# Patient Record
Sex: Female | Born: 1950
[De-identification: ages and names within clinical notes are randomized; demographics above are authoritative.]

---

## 2017-08-18 ENCOUNTER — Emergency Department (HOSPITAL_COMMUNITY)
Admit: 2017-08-18 | Discharge: 2017-08-18 | Disposition: A | Discharge status: Home / Self Care | Payer: BLUE CROSS/BLUE SHIELD

## 2017-08-18 ENCOUNTER — Inpatient Hospital Stay (HOSPITAL_COMMUNITY)
Admit: 2017-08-18 | Discharge: 2017-08-18 | Disposition: A | Discharge status: Home / Self Care | Payer: BLUE CROSS/BLUE SHIELD

## 2017-08-18 DIAGNOSIS — R42 Dizziness and giddiness: Secondary | ICD-10-CM

## 2017-08-18 DIAGNOSIS — R519 Acute nonintractable headache, unspecified headache type: Secondary | ICD-10-CM

## 2017-08-18 DIAGNOSIS — R51 Headache: Secondary | ICD-10-CM

## 2017-12-23 ENCOUNTER — Ambulatory Visit

## 2017-12-23 DIAGNOSIS — Z853 Personal history of malignant neoplasm of breast: Secondary | ICD-10-CM

## 2017-12-23 DIAGNOSIS — E559 Vitamin D deficiency, unspecified: Secondary | ICD-10-CM

## 2017-12-25 ENCOUNTER — Ambulatory Visit: Admit: 2017-12-25 | Discharge: 2017-12-25 | Payer: BLUE CROSS/BLUE SHIELD

## 2017-12-25 DIAGNOSIS — Z853 Personal history of malignant neoplasm of breast: Secondary | ICD-10-CM

## 2017-12-25 DIAGNOSIS — E559 Vitamin D deficiency, unspecified: Secondary | ICD-10-CM

## 2017-12-25 DIAGNOSIS — F17219 Nicotine dependence, cigarettes, with unspecified nicotine-induced disorders: Secondary | ICD-10-CM

## 2017-12-25 DIAGNOSIS — F17200 Nicotine dependence, unspecified, uncomplicated: Secondary | ICD-10-CM

## 2018-01-13 ENCOUNTER — Inpatient Hospital Stay: Admit: 2018-01-13 | Discharge: 2018-01-14 | Payer: BLUE CROSS/BLUE SHIELD

## 2018-01-13 DIAGNOSIS — F17200 Nicotine dependence, unspecified, uncomplicated: Secondary | ICD-10-CM

## 2018-01-13 DIAGNOSIS — F17219 Nicotine dependence, cigarettes, with unspecified nicotine-induced disorders: Secondary | ICD-10-CM

## 2018-01-14 DIAGNOSIS — Z122 Encounter for screening for malignant neoplasm of respiratory organs: Secondary | ICD-10-CM

## 2018-01-14 DIAGNOSIS — F17210 Nicotine dependence, cigarettes, uncomplicated: Secondary | ICD-10-CM

## 2019-01-26 ENCOUNTER — Ambulatory Visit: Admit: 2019-01-26 | Discharge: 2019-01-26 | Payer: BLUE CROSS/BLUE SHIELD

## 2019-01-26 DIAGNOSIS — Z9013 Acquired absence of bilateral breasts and nipples: Secondary | ICD-10-CM

## 2019-01-26 DIAGNOSIS — R030 Elevated blood-pressure reading, without diagnosis of hypertension: Secondary | ICD-10-CM

## 2019-01-26 DIAGNOSIS — F17200 Nicotine dependence, unspecified, uncomplicated: Secondary | ICD-10-CM

## 2019-01-26 DIAGNOSIS — E559 Vitamin D deficiency, unspecified: Secondary | ICD-10-CM

## 2019-01-26 DIAGNOSIS — Z853 Personal history of malignant neoplasm of breast: Secondary | ICD-10-CM

## 2019-01-26 DIAGNOSIS — Z08 Encounter for follow-up examination after completed treatment for malignant neoplasm: Secondary | ICD-10-CM

## 2019-01-26 DIAGNOSIS — F17219 Nicotine dependence, cigarettes, with unspecified nicotine-induced disorders: Secondary | ICD-10-CM

## 2019-02-02 DIAGNOSIS — Z122 Encounter for screening for malignant neoplasm of respiratory organs: Secondary | ICD-10-CM

## 2019-02-02 DIAGNOSIS — F17200 Nicotine dependence, unspecified, uncomplicated: Secondary | ICD-10-CM

## 2019-02-03 ENCOUNTER — Inpatient Hospital Stay: Admit: 2019-02-02 | Discharge: 2019-02-03 | Payer: BLUE CROSS/BLUE SHIELD

## 2019-02-03 DIAGNOSIS — F17219 Nicotine dependence, cigarettes, with unspecified nicotine-induced disorders: Secondary | ICD-10-CM

## 2019-04-14 ENCOUNTER — Ambulatory Visit

## 2019-04-14 DIAGNOSIS — Z23 Encounter for immunization: Secondary | ICD-10-CM

## 2019-07-16 DIAGNOSIS — M549 Dorsalgia, unspecified: Secondary | ICD-10-CM

## 2019-07-16 DIAGNOSIS — M791 Myalgia, unspecified site: Secondary | ICD-10-CM

## 2019-07-16 DIAGNOSIS — M533 Sacrococcygeal disorders, not elsewhere classified: Secondary | ICD-10-CM

## 2019-07-17 ENCOUNTER — Emergency Department (HOSPITAL_COMMUNITY)
Admit: 2019-07-17 | Discharge: 2019-07-17 | Disposition: A | Discharge status: Home / Self Care | Payer: BLUE CROSS/BLUE SHIELD

## 2019-07-17 ENCOUNTER — Inpatient Hospital Stay (HOSPITAL_COMMUNITY)
Admit: 2019-07-17 | Discharge: 2019-07-17 | Disposition: A | Discharge status: Home / Self Care | Payer: BLUE CROSS/BLUE SHIELD

## 2019-07-17 DIAGNOSIS — M549 Dorsalgia, unspecified: Secondary | ICD-10-CM

## 2019-07-17 DIAGNOSIS — M533 Sacrococcygeal disorders, not elsewhere classified: Secondary | ICD-10-CM

## 2019-07-17 MED ORDER — NAPROXEN 500 MG TABLET
500 mg | ORAL_TABLET | Freq: Two times a day (BID) | ORAL | 0 refills | Status: AC
Start: 2019-07-17 — End: ?

## 2019-07-17 MED ORDER — METHOCARBAMOL 500 MG TABLET
500 mg | ORAL_TABLET | Freq: Four times a day (QID) | ORAL | 0 refills | 30.00 days | Status: AC | PRN
Start: 2019-07-17 — End: ?

## 2020-01-12 ENCOUNTER — Ambulatory Visit (HOSPITAL_COMMUNITY)

## 2020-01-12 DIAGNOSIS — Z853 Personal history of malignant neoplasm of breast: Secondary | ICD-10-CM

## 2020-01-12 DIAGNOSIS — E559 Vitamin D deficiency, unspecified: Secondary | ICD-10-CM

## 2020-01-17 ENCOUNTER — Inpatient Hospital Stay: Admit: 2020-01-17 | Discharge: 2020-01-21 | Payer: Commercial Managed Care - Pharmacy Benefit Manager

## 2020-01-17 DIAGNOSIS — J069 Acute upper respiratory infection, unspecified: Secondary | ICD-10-CM

## 2020-01-17 DIAGNOSIS — R0902 Hypoxemia: Secondary | ICD-10-CM

## 2020-01-17 DIAGNOSIS — J441 Chronic obstructive pulmonary disease with (acute) exacerbation: Secondary | ICD-10-CM

## 2020-01-20 MED ORDER — PREDNISONE 10 MG TABLET
ORAL_TABLET | 0 refills | Status: AC
Start: 2020-01-20 — End: ?

## 2020-01-20 MED ORDER — BENZONATATE 200 MG CAPSULE
200 mg | ORAL_CAPSULE | Freq: Three times a day (TID) | ORAL | 0 refills | Status: AC | PRN
Start: 2020-01-20 — End: ?

## 2020-01-20 MED ORDER — GUAIFENESIN ER 600 MG TABLET, EXTENDED RELEASE 12 HR
600 mg | ORAL_TABLET | Freq: Two times a day (BID) | ORAL | 0 refills | 30.00 days | Status: AC
Start: 2020-01-20 — End: ?

## 2020-12-29 ENCOUNTER — Ambulatory Visit (HOSPITAL_COMMUNITY): Admit: 2020-12-29 | Discharge: 2020-12-29 | Payer: Commercial Managed Care - Pharmacy Benefit Manager

## 2020-12-29 ENCOUNTER — Ambulatory Visit (HOSPITAL_COMMUNITY)

## 2020-12-29 DIAGNOSIS — E559 Vitamin D deficiency, unspecified: Secondary | ICD-10-CM

## 2020-12-29 DIAGNOSIS — Z13820 Encounter for screening for osteoporosis: Secondary | ICD-10-CM

## 2020-12-29 DIAGNOSIS — Z853 Personal history of malignant neoplasm of breast: Secondary | ICD-10-CM

## 2020-12-29 DIAGNOSIS — Z08 Encounter for follow-up examination after completed treatment for malignant neoplasm: Secondary | ICD-10-CM

## 2020-12-29 DIAGNOSIS — M4854XA Collapsed vertebra, not elsewhere classified, thoracic region, initial encounter for fracture: Secondary | ICD-10-CM

## 2020-12-29 DIAGNOSIS — F17219 Nicotine dependence, cigarettes, with unspecified nicotine-induced disorders: Secondary | ICD-10-CM

## 2020-12-29 DIAGNOSIS — Z780 Asymptomatic menopausal state: Secondary | ICD-10-CM

## 2021-01-05 ENCOUNTER — Inpatient Hospital Stay (HOSPITAL_COMMUNITY): Admit: 2021-01-05 | Discharge: 2021-01-05 | Payer: Commercial Managed Care - Pharmacy Benefit Manager

## 2021-01-05 DIAGNOSIS — F17219 Nicotine dependence, cigarettes, with unspecified nicotine-induced disorders: Secondary | ICD-10-CM

## 2021-03-13 IMAGING — CT CT ABDOMEN PELVIS W/ UROGRAM
2 of 6 series · 14 of 46 positions shown, 16 images · IV contrast (APPLIED)
Comparison: None.

________________________________________________________________________________________________ 
CT ABDOMEN PELVIS W/ UROGRAM, 03/13/2021 [DATE]: 
CLINICAL INDICATION: Recent UTI. 
A search for DICOM formatted images was conducted for prior CT imaging studies 
completed at a non-affiliated media free facility.
TECHNIQUE: The region of interest was scanned without and with 100 mL Isovue 
300 IV contrast injected intravenously on a high resolution CT scanner using 
dose reduction techniques.  Routine MPR reconstructions were performed.

[Series 5: portal · axial · portal-venous · 0.88mm/px · z∈[-476,-83]mm · 11 of 159 slices shown, 13 images]
[im 14/159  soft-tissue]
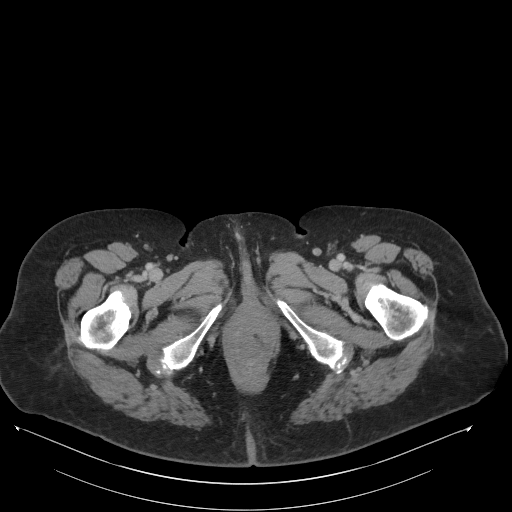
[im 14/159  bone]
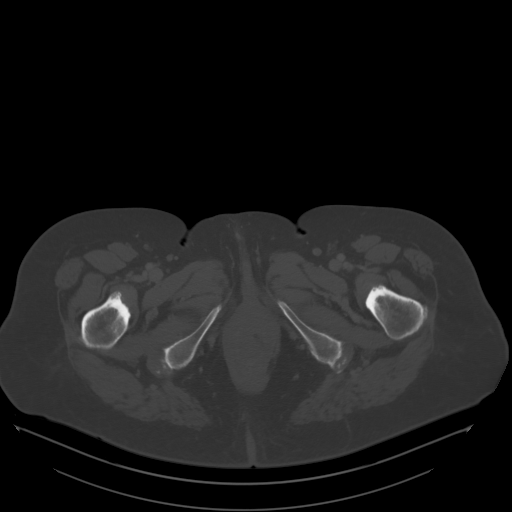
[im 27/159  soft-tissue]
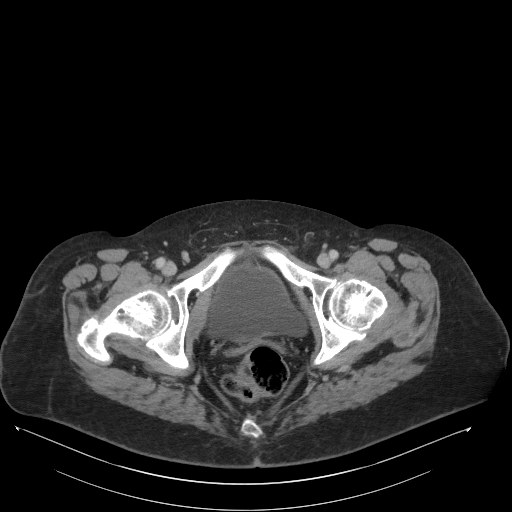
[im 40/159  soft-tissue]
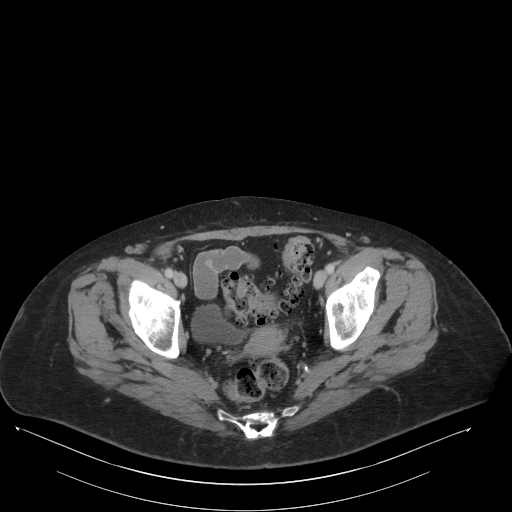
[im 53/159  soft-tissue]
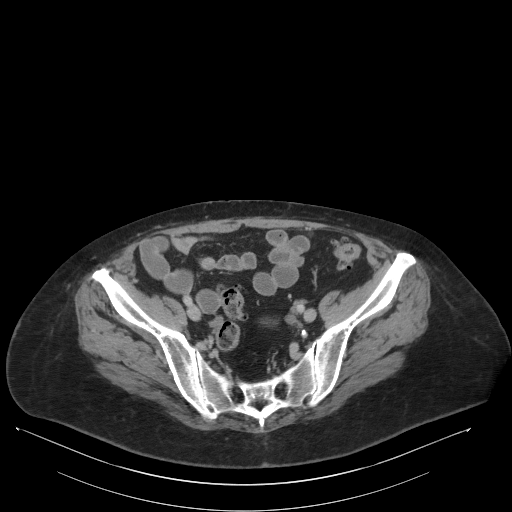
[im 66/159  soft-tissue]
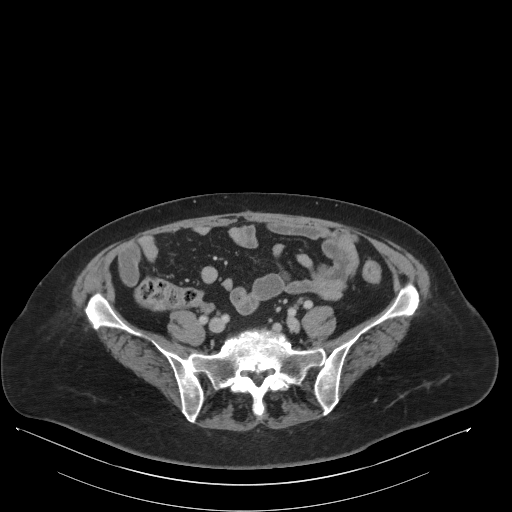
[im 80/159  soft-tissue]
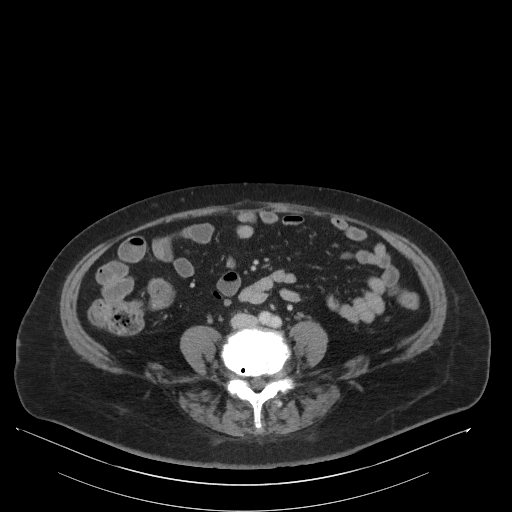
[im 93/159  soft-tissue]
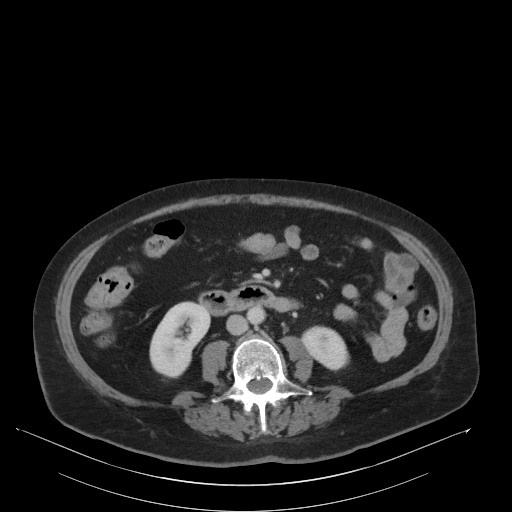
[im 106/159  soft-tissue]
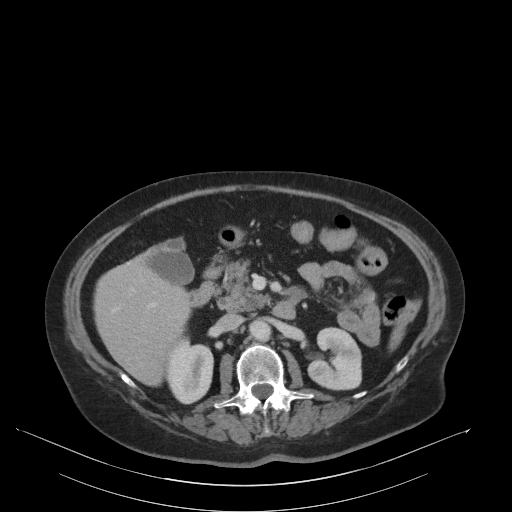
[im 119/159  soft-tissue]
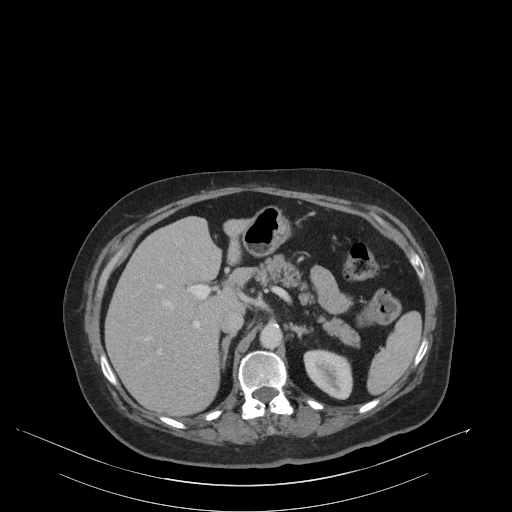
[im 119/159  bone]
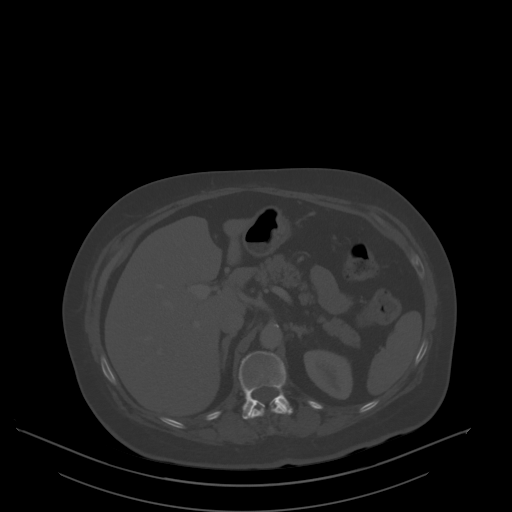
[im 132/159  soft-tissue]
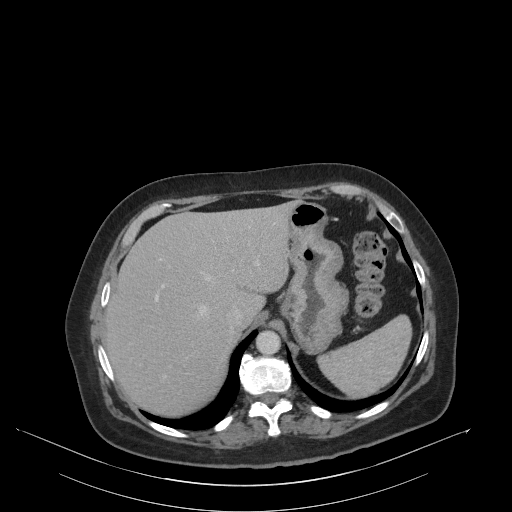
[im 145/159  soft-tissue]
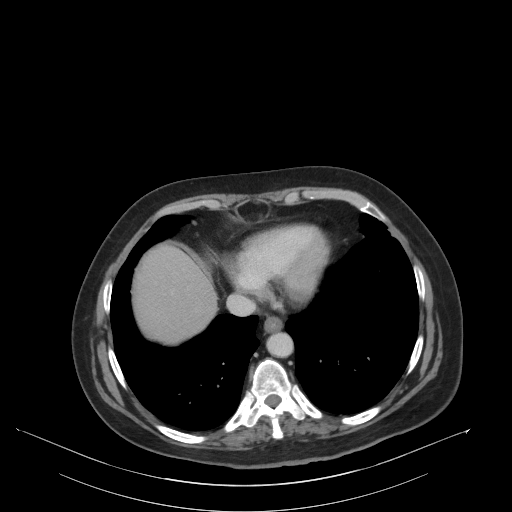

[Series 6: coronal · coronal · 0.94mm/px · 3 of 151 slices shown]
[im 38/151  soft-tissue]
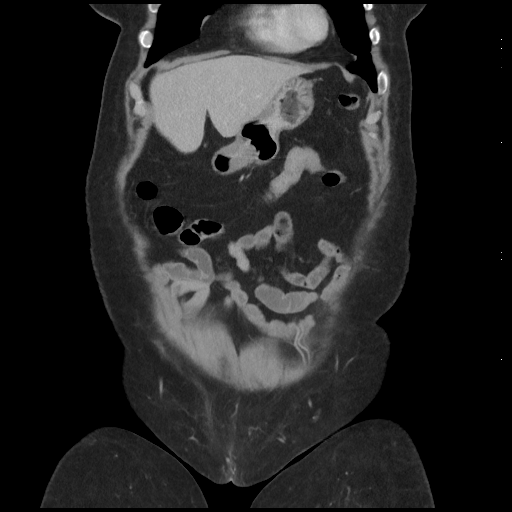
[im 76/151  soft-tissue]
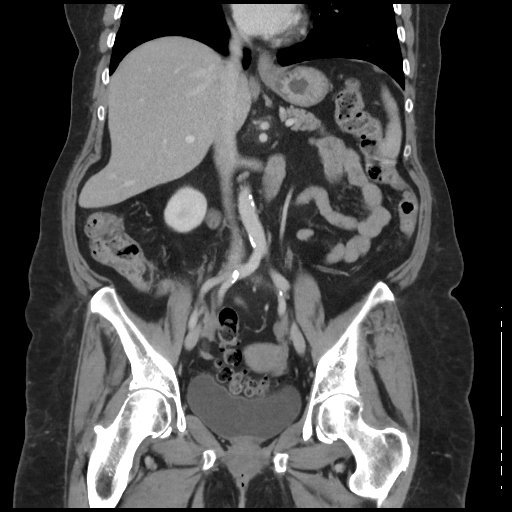
[im 113/151  soft-tissue]
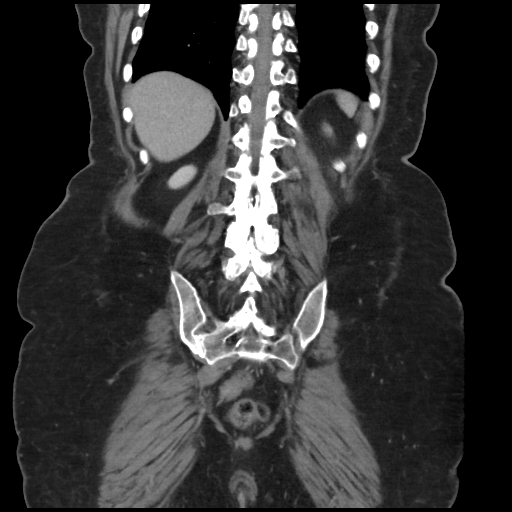

[14 of 46 positions shown; findings below may reference images not displayed]

FINDINGS: The kidneys appear normal in size. The RIGHT kidney enhances normally. In 
contrast there are patchy areas of ill-defined decreased enhancement throughout 
the LEFT renal parenchyma most likely reflecting pyelonephritis. The renal 
collecting structures are decompressed. There is no nephrolithiasis. No 
urothelial thickening or filling defects are found in either kidney. Both of the 
ureters are normal. The urinary bladder is within normal limits. The uterus is 
unremarkable. No adnexal pathology is found. There is severe chronic 
diverticular disease in the sigmoid colon with mild scattered diverticula more 
proximally along the colon. No acute appearing colonic pathology is found. The 
appendix is normal. No small bowel pathology is found. The stomach is within 
normal limits. The gallbladder and biliary tract are normal. Small hepatic cysts 
are present. There is an incidental portal vein to hepatic venous fistula in the 
inferior RIGHT lobe of the liver. Portal and hepatic veins are otherwise 
unremarkable. The main trunk of the superior mesenteric vein and inferior vena 
cava are within normal limits. Abdominal aorta shows mild atherosclerotic 
calcifications. There is no abdominal aortic aneurysm. The pancreas and spleen 
are within normal limits. The adrenal glands are unremarkable. No abnormal 
abdominal or pelvic lymph nodes are detected. There is no ascites. Degenerative 
changes are in the spine and both hips.
IMPRESSION: 1. There is patchy cortical enhancement in the LEFT kidney suspicious for 
pyelonephritis. The urinary tract is otherwise unremarkable. 
2. There is severe chronic diverticular disease in the distal colon without 
acute colonic pathology. 
3. Incidental portal vein to hepatic vein fistula in the liver. 
4. No evidence for abdominal or pelvic malignancy. 
RADIATION DOSE REDUCTION: All CT scans are performed using radiation dose 
reduction techniques, when applicable.  Technical factors are evaluated and 
adjusted to ensure appropriate moderation of exposure.  Automated dose 
management technology is applied to adjust the radiation doses to minimize 
exposure while achieving diagnostic quality images.

## 2021-05-09 DIAGNOSIS — Z13820 Encounter for screening for osteoporosis: Secondary | ICD-10-CM

## 2021-05-09 DIAGNOSIS — Z780 Asymptomatic menopausal state: Secondary | ICD-10-CM

## 2021-05-10 ENCOUNTER — Inpatient Hospital Stay (HOSPITAL_COMMUNITY): Admit: 2021-05-09 | Discharge: 2021-05-10 | Payer: Commercial Managed Care - Pharmacy Benefit Manager

## 2021-05-10 DIAGNOSIS — M4854XA Collapsed vertebra, not elsewhere classified, thoracic region, initial encounter for fracture: Secondary | ICD-10-CM

## 2021-05-10 DIAGNOSIS — M810 Age-related osteoporosis without current pathological fracture: Secondary | ICD-10-CM

## 2021-05-11 ENCOUNTER — Telehealth (HOSPITAL_COMMUNITY)

## 2021-05-11 DIAGNOSIS — M810 Age-related osteoporosis without current pathological fracture: Secondary | ICD-10-CM

## 2021-05-11 DIAGNOSIS — C7951 Secondary malignant neoplasm of bone: Secondary | ICD-10-CM

## 2021-05-23 ENCOUNTER — Ambulatory Visit (HOSPITAL_COMMUNITY)

## 2021-05-23 DIAGNOSIS — M810 Age-related osteoporosis without current pathological fracture: Secondary | ICD-10-CM

## 2021-05-23 MED ORDER — PROLIA 60 MG/ML SUBCUTANEOUS SYRINGE
60 mg | Freq: Once | SUBCUTANEOUS | 2 refills | Status: AC
Start: 2021-05-23 — End: ?
  Filled 2021-05-27: qty 1, 180d supply, fill #0

## 2021-05-29 ENCOUNTER — Ambulatory Visit (HOSPITAL_COMMUNITY): Admit: 2021-05-29 | Discharge: 2021-05-29 | Payer: Commercial Managed Care - Pharmacy Benefit Manager

## 2021-05-29 ENCOUNTER — Ambulatory Visit (HOSPITAL_COMMUNITY)

## 2021-05-29 DIAGNOSIS — M810 Age-related osteoporosis without current pathological fracture: Secondary | ICD-10-CM

## 2021-05-29 IMAGING — MR MRI LUMBAR SPINE WITHOUT CONTRAST
4 of 8 series · 8 of 48 positions shown · IV contrast (gadolinium)
Comparison: 03/13/2021 CT abdomen/pelvis

________________________________________________________________________________________________ 
MRI LUMBAR SPINE WITHOUT CONTRAST, 05/29/2021 [DATE]: 
CLINICAL INDICATION: Lumbar radiculopathy. Low back pain.
TECHNIQUE: Multiplanar, multiecho position MR images of the lumbar spine were 
performed without intravenous gadolinium enhancement. Patient was scanned on a 
1.5T magnet.

[Series 101: survey · axial · 10.0mm · 1.25mm/px · 1 of 10 slices shown]
[im 1/10]
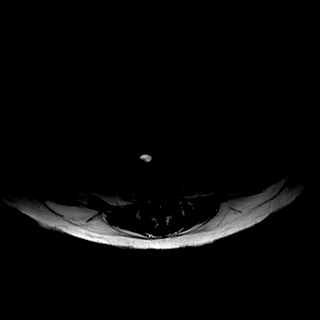

[Series 201: t2w_cor-surv · coronal · 6.0mm · 0.59mm/px · 1 of 10 slices shown]
[im 1/10]
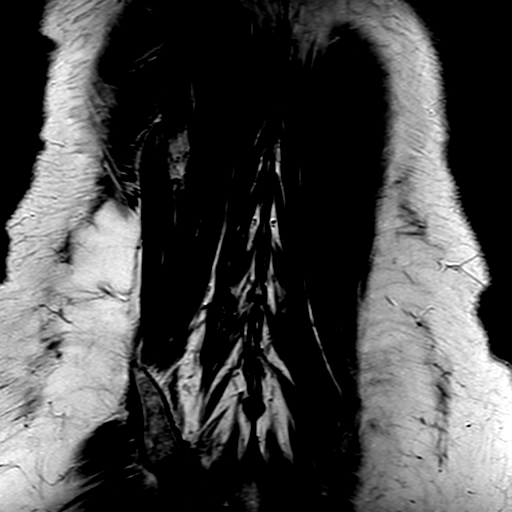

[Series 301: t1_tse_sag · sagittal · 4.0mm · 0.48mm/px · 3 of 18 slices shown]
[im 1/18]
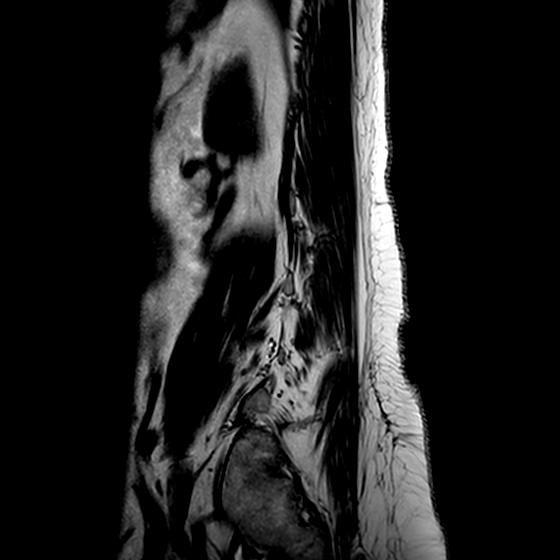
[im 9/18]
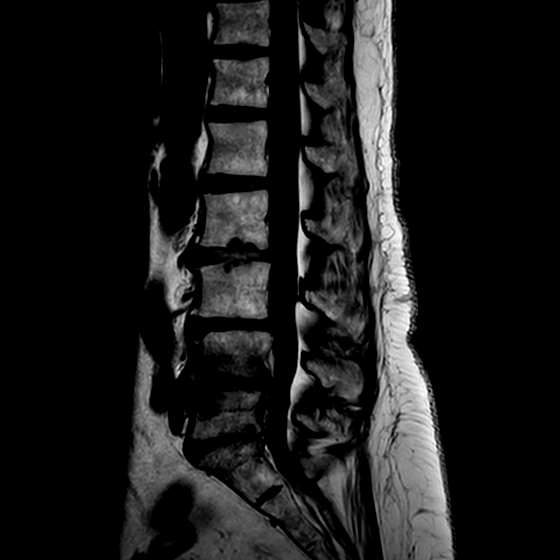
[im 18/18]
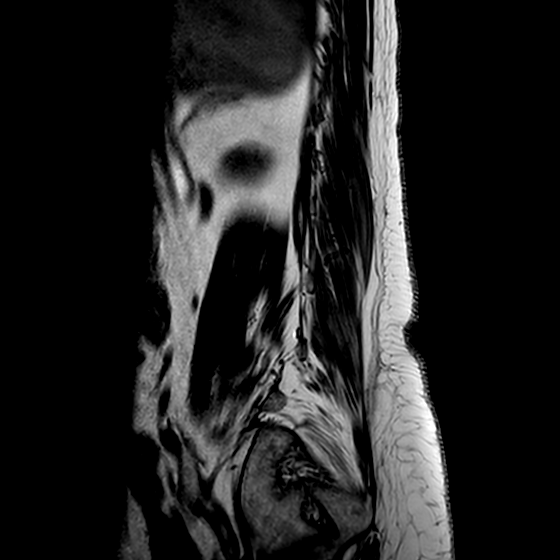

[Series 402: (id) view_ax mpr · axial · 1.0mm · 0.25mm/px · z∈[-63,+90]mm · 3 of 118 slices shown]
[im 15/118]
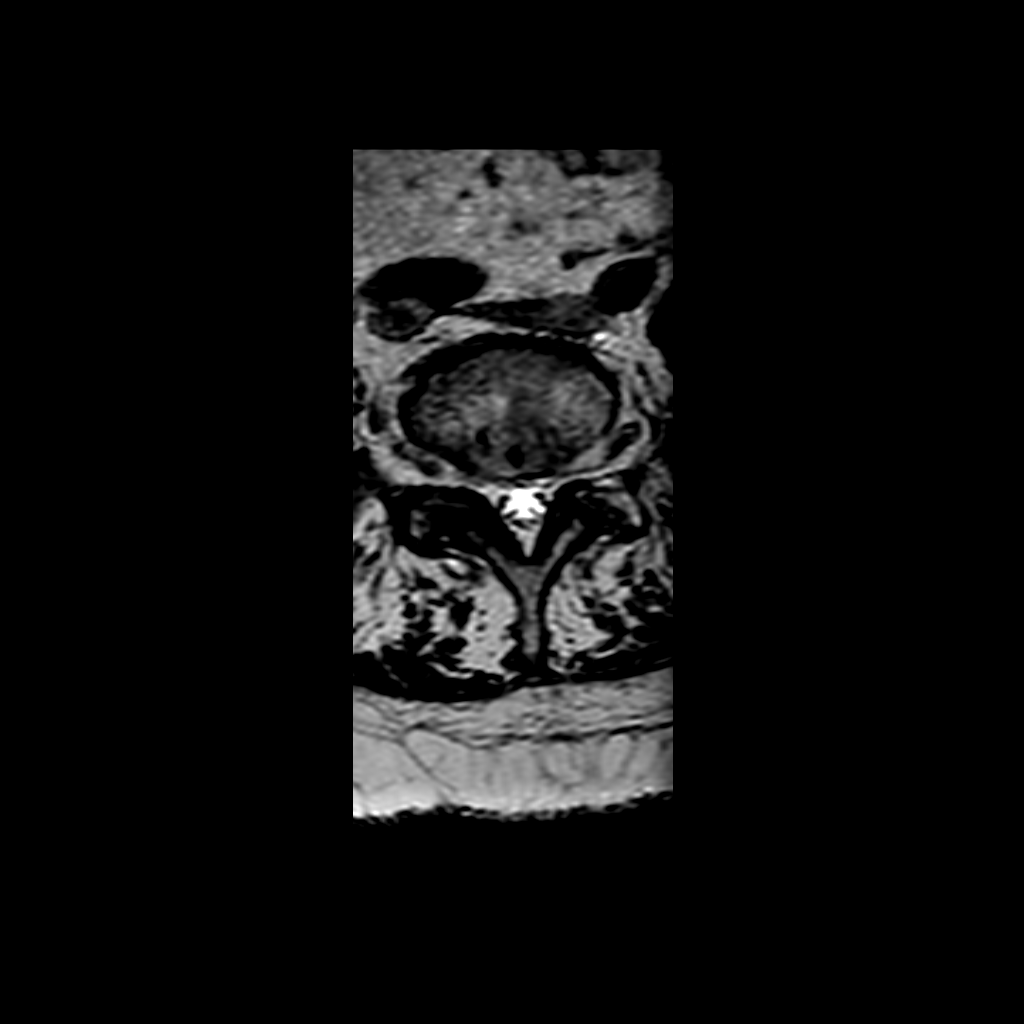
[im 59/118]
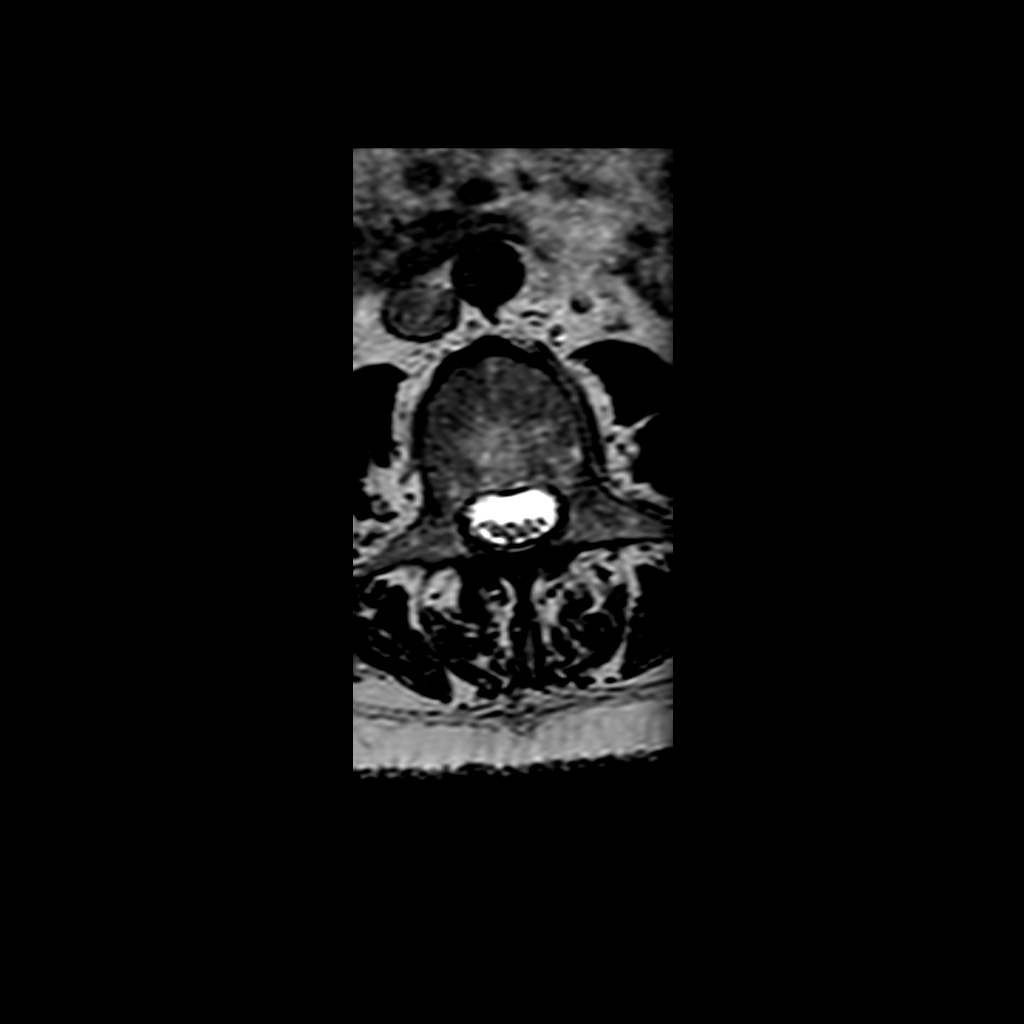
[im 103/118]
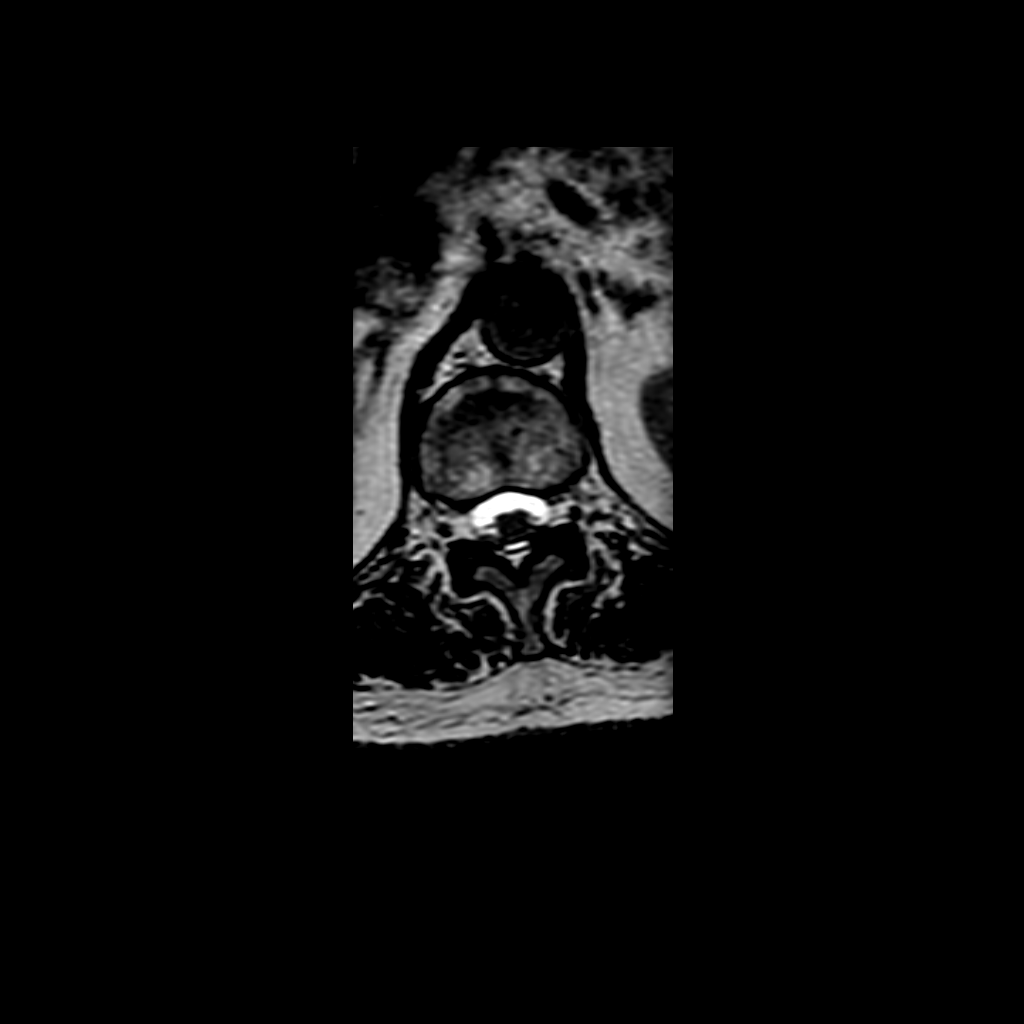

[8 of 48 positions shown; findings below may reference images not displayed]

FINDINGS: GENERAL: 
Nomenclature is based on 5 lumbar type vertebral bodies.     
ALIGNMENT: 3 mm anterolisthesis at L3-4 
VERTEBRAE: No acute fracture. Mild chronic loss of height of the L4 and L5 
vertebral bodies and degenerative endplate cystic and hypertrophic change. 
Multilevel osteophytes.  
MARROW SIGNAL: No focal suspect signal abnormality. 
CORD SIGNAL: Normal distal spinal cord and cauda equina. Conus medullaris 
terminates at L1. 
EXTRASPINAL STRUCTURES: Unremarkable. 
Modic I-II: Type I Modic changes at L4-5 
Ligamentum Flavum > 2.5 mm: All levels 
SEGMENTAL: 
T12-L1: Normal disc height and signal. No herniation. Normal facets. No spinal 
canal or neural foraminal stenosis. 
L1-L2: Normal disc height and signal. No herniation. Normal facets. No spinal 
canal or neural foraminal stenosis. 
L2-L3: Disc bulge, mild disc space narrowing and disc desiccation. No 
herniation. Mild facet arthropathy. Mild central canal stenosis. No neural 
foraminal stenosis. 
L3-L4: 3 mm anterolisthesis. Disc bulge, moderate disc space narrowing and disc 
desiccation. No herniation. Marked facet arthropathy. Moderate central canal 
stenosis (thecal sac measures 8 mm in AP dimensions). Marked mild lateral recess 
stenosis. No neural foraminal stenosis. 
L4-L5: Osteophyte-disc complex with left subarticular disc protrusion measuring 
0.7 cm in AP dimensions and 1.0 cm in width. Moderate disc space narrowing and 
disc desiccation. Moderate facet arthropathy. Moderate central canal stenosis 
(thecal sac measures 8 mm in AP dimensions). Marked left and moderate right 
lateral recess stenosis, with potential encroachment on the left L5 nerve root. 
Mild neural foraminal stenosis. 
L5-S1: Moderate disc space narrowing and disc desiccation. No herniation. Mild 
facet arthropathy and ligamentum flavum hypertrophy. No central canal stenosis. 
Mild lateral recess stenosis. Mild neural foraminal stenosis. 
IMPRESSION 
1.  L4-L5 osteophyte-disc complex with left subarticular disc protrusion, 
moderate central canal stenosis, marked left/moderate right lateral recess 
stenosis (with potential encroachment on the left L5 nerve root) and mild neural 
foraminal stenosis. 
2.  Multifocal degenerative change and grade 1 anterolisthesis at L3-4. 
3.  Central canal stenosis is moderate at L3-4 and mild at L2-3. Variable 
lateral recess stenosis and mild L5-S1 neural foraminal stenosis.

## 2021-08-01 IMAGING — DX SHOULDER LEFT 2 VIEWS
1 series · 3 of 3 positions shown · non-contrast
Comparison: None.

________________________________________________________________________________________________ 
SHOULDER LEFT 2 VIEWS, 08/01/2021 [DATE]: 
CLINICAL INDICATION: Left shoulder pain.

[Series 1: AP · 0.14mm/px · 3 of 3 slices shown]
[im 1/3]
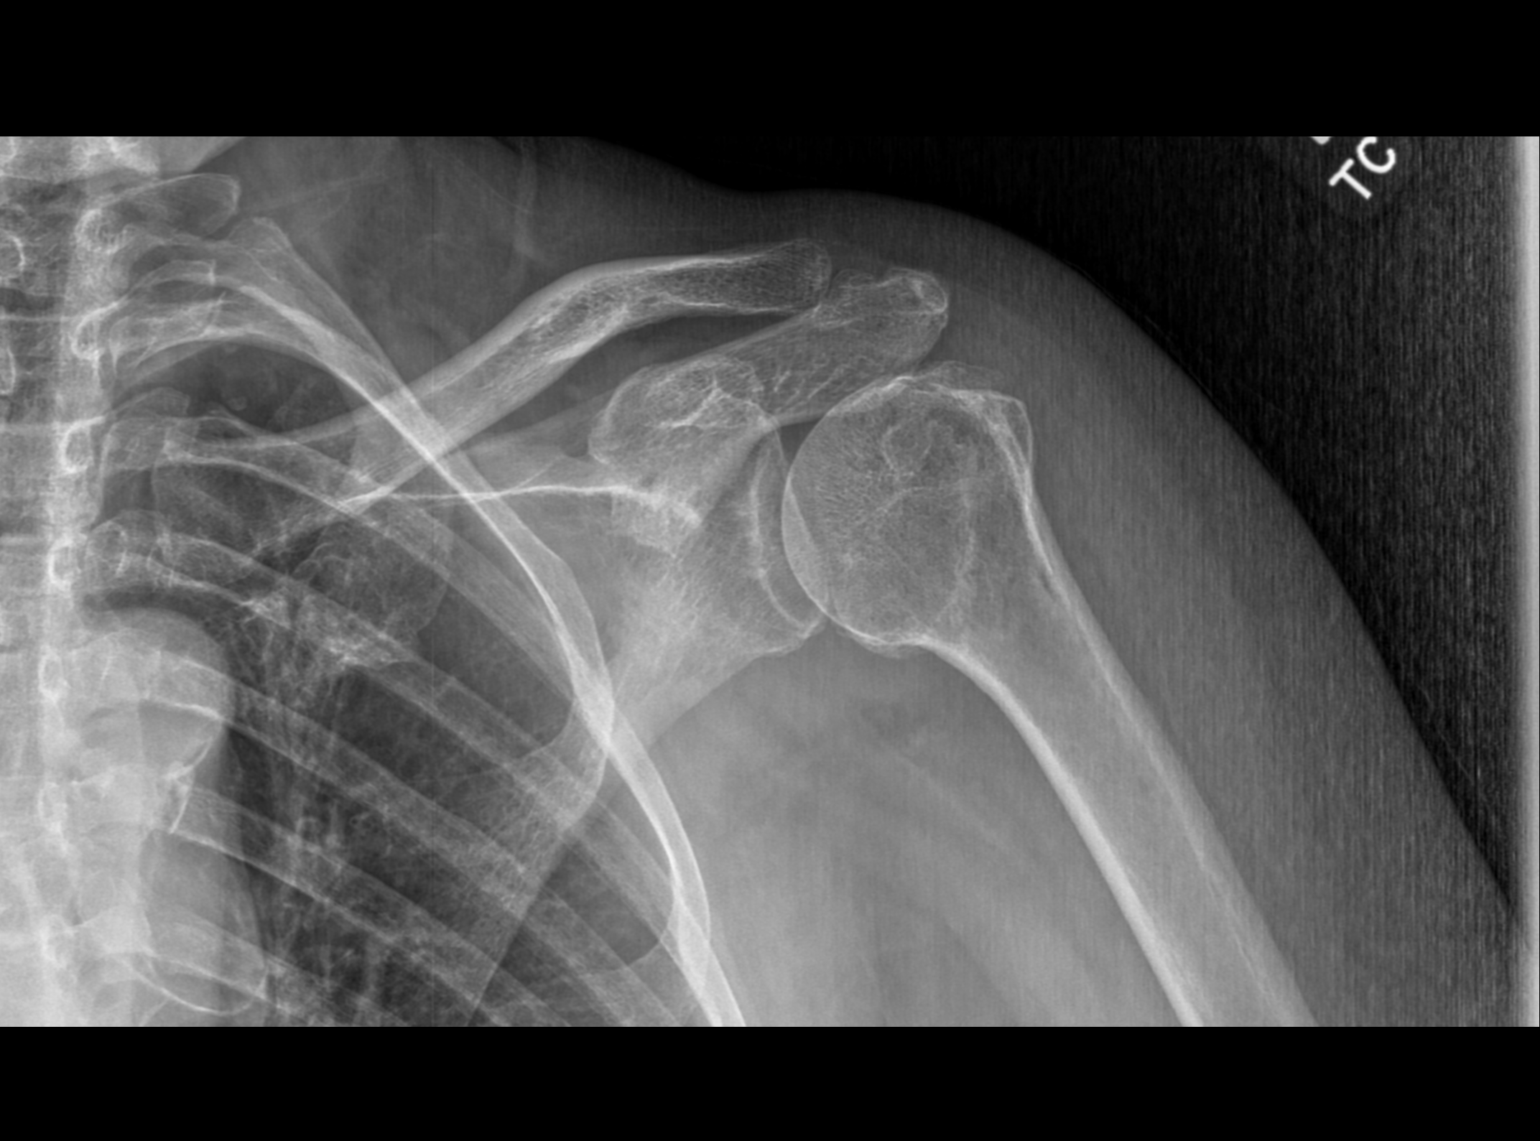
[im 2/3]
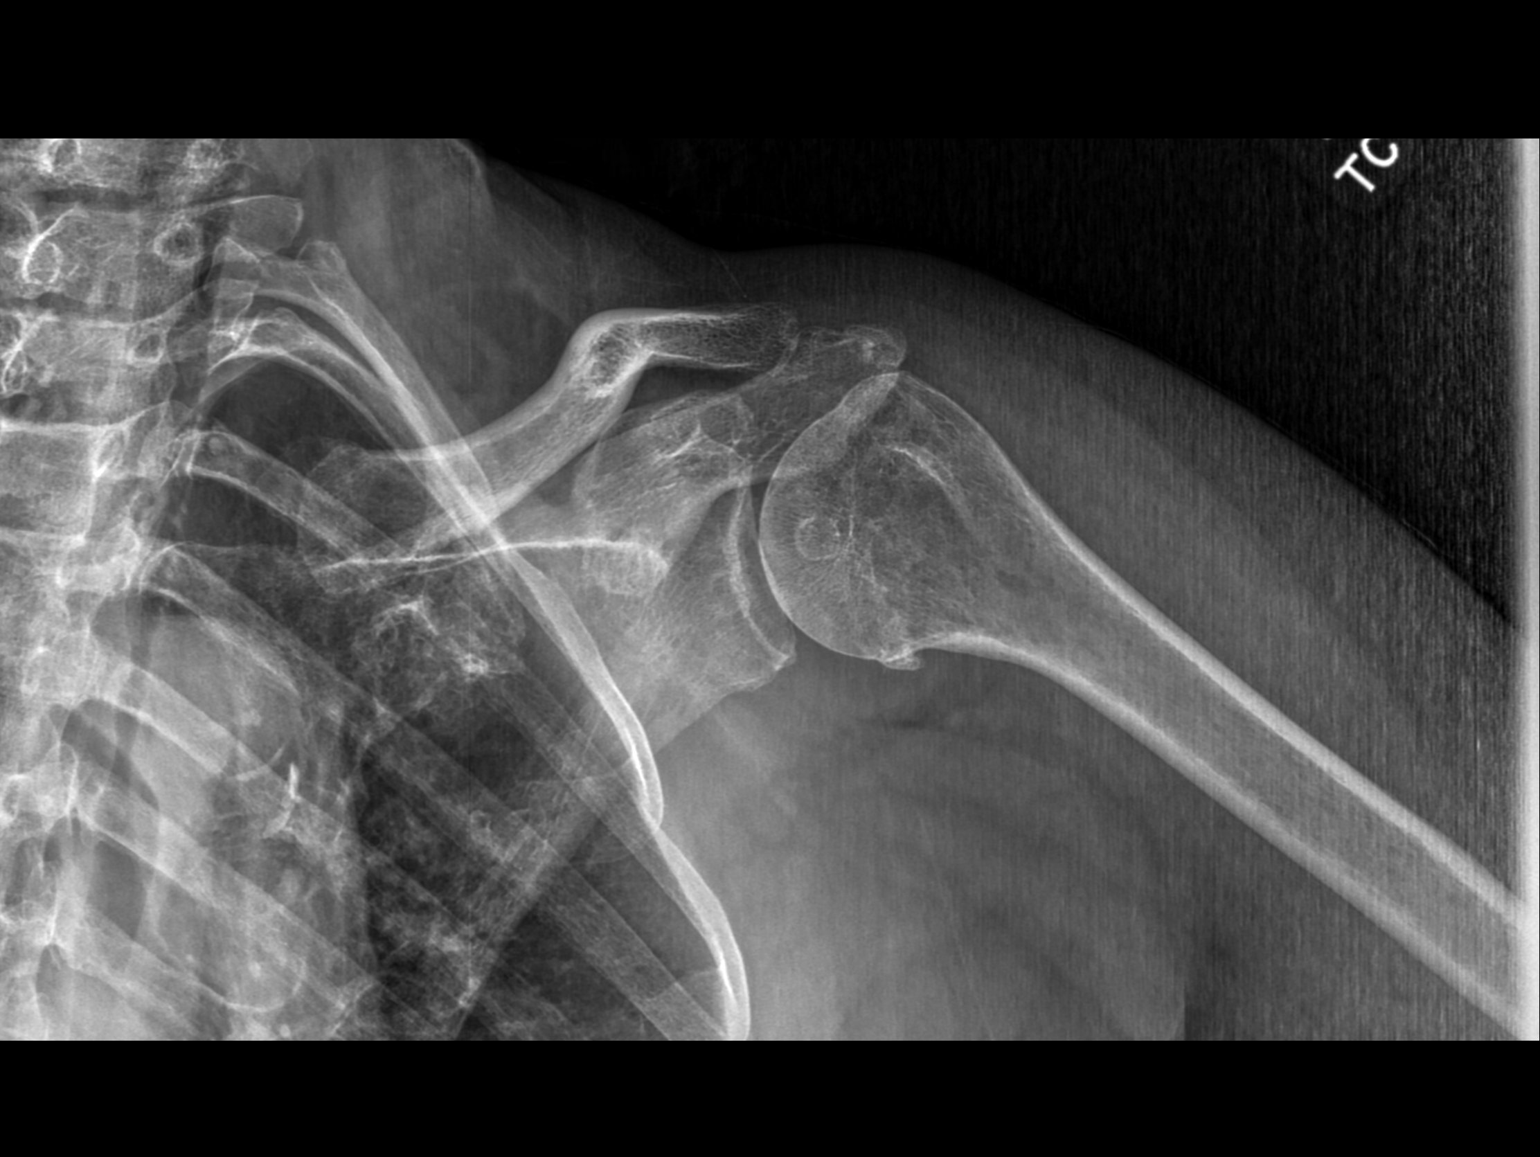
[im 3/3]
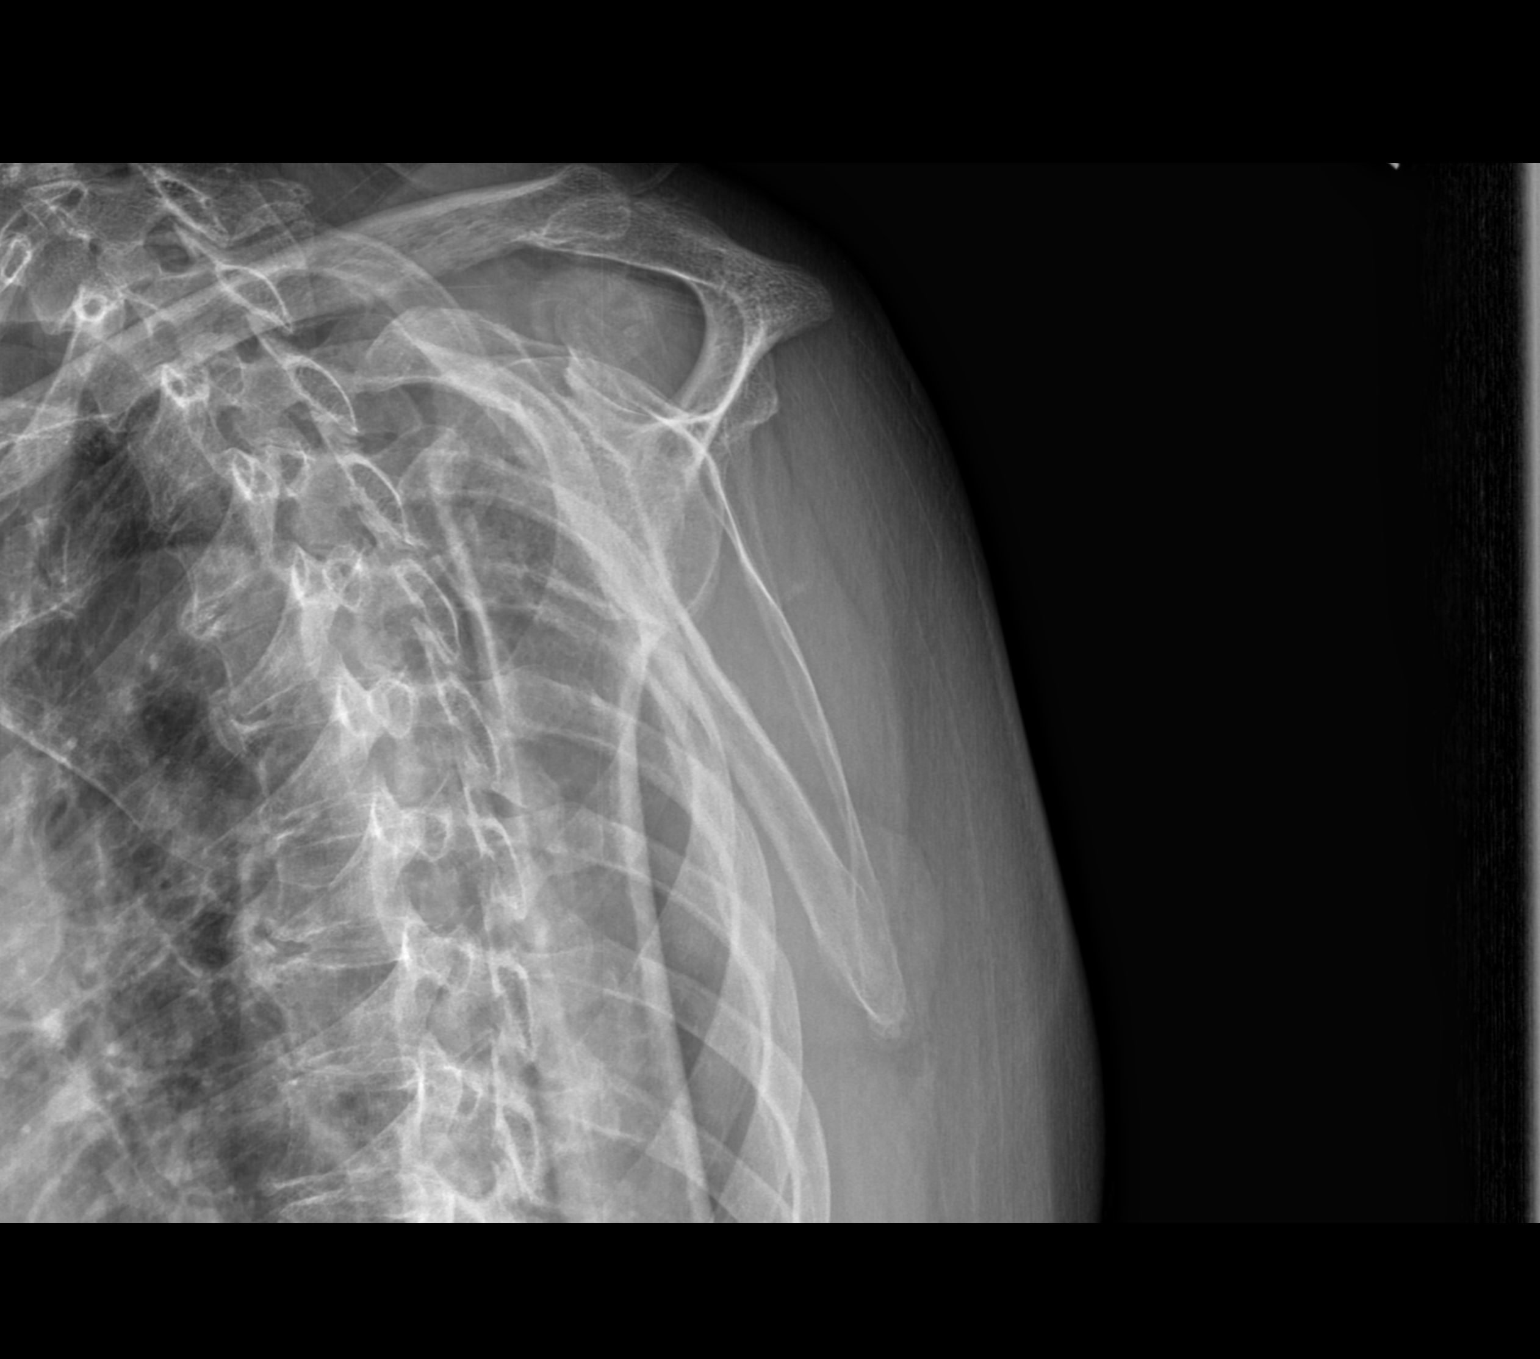

[3 of 3 positions shown; findings below may reference images not displayed]

FINDINGS: No fractures or dislocations. Glenohumeral joint space width is 
preserved. Humeral osteophytes and humeral head subcortical cysts. Mild 
degenerative change of the AC joint. Atherosclerosis. Normal bone density. No 
soft tissue swelling.
IMPRESSION: Mild degenerative change.

## 2021-12-11 IMAGING — MR MRI LEFT SHOULDER WITHOUT CONTRAST
5 of 6 series · 27 of 40 positions shown · IV contrast (gadolinium)
Comparison: 08/01/2021 radiographs

________________________________________________________________________________________________ 
MRI LEFT SHOULDER WITHOUT CONTRAST, 12/11/2021 [DATE]: 
CLINICAL INDICATION: Sprain of left rotator cuff capsule, subsequent encounter.
TECHNIQUE: Multiplanar, multiecho position MR images of the shoulder were 
performed without intravenous gadolinium enhancement. Patient was scanned on a 
1.5T magnet.

[Series 201: survey left · axial · left · 10.0mm · 0.71mm/px · z∈[-40,+125]mm · 5 of 15 slices shown]
[im 1/15]
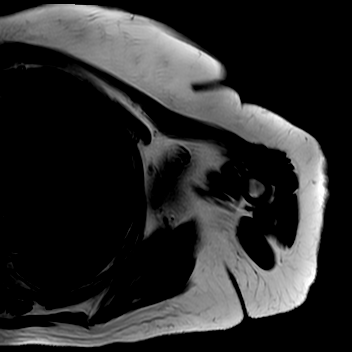
[im 4/15]
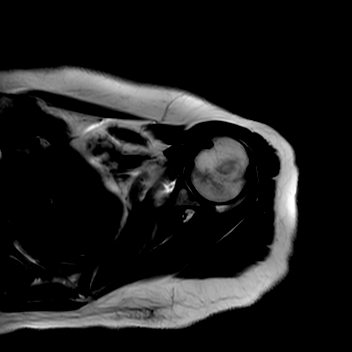
[im 8/15]
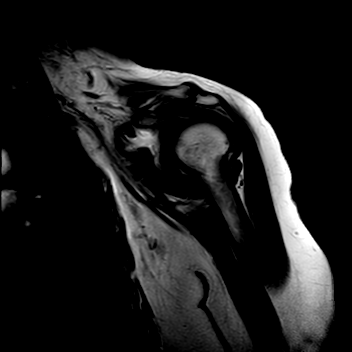
[im 11/15]
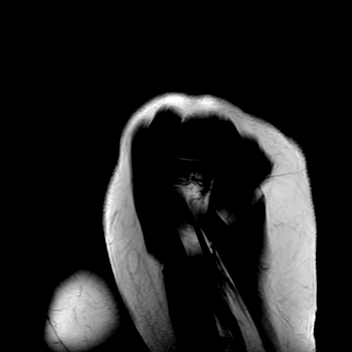
[im 15/15]
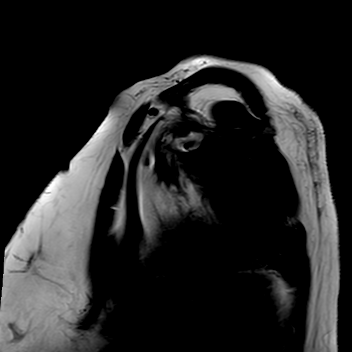

[Series 301: (person_name)_(person_name)_(person_name) · axial · left · 3.5mm · 0.42mm/px · z∈[-42,+65]mm · 7 of 28 slices shown]
[im 1/28]
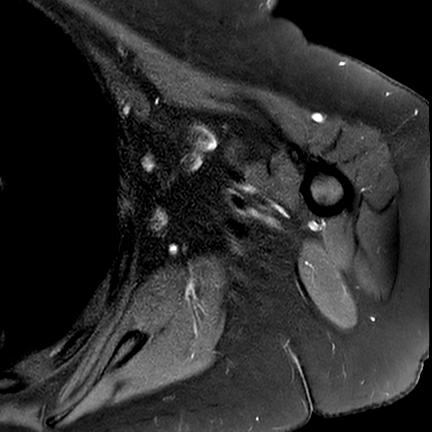
[im 5/28]
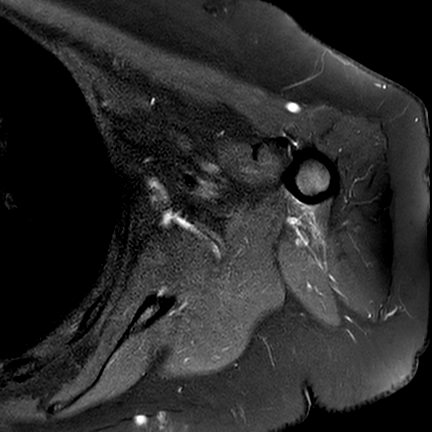
[im 10/28]
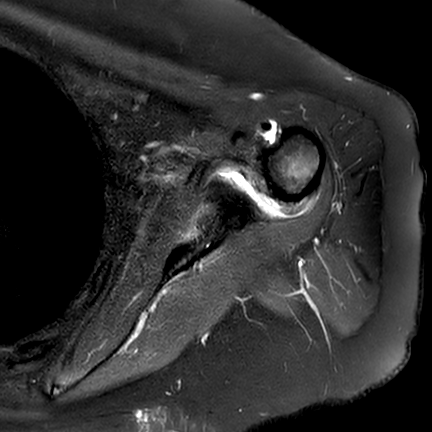
[im 14/28]
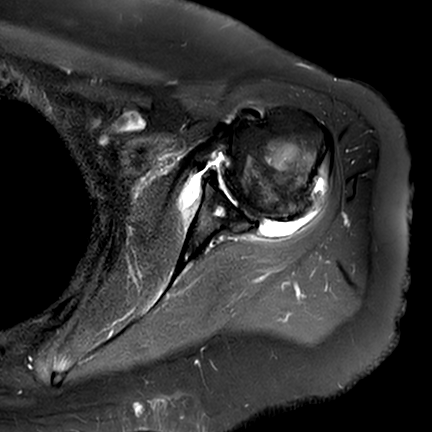
[im 19/28]
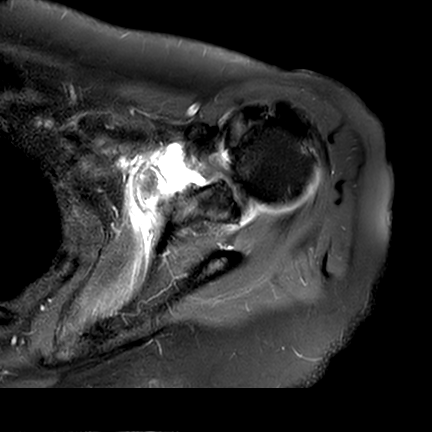
[im 23/28]
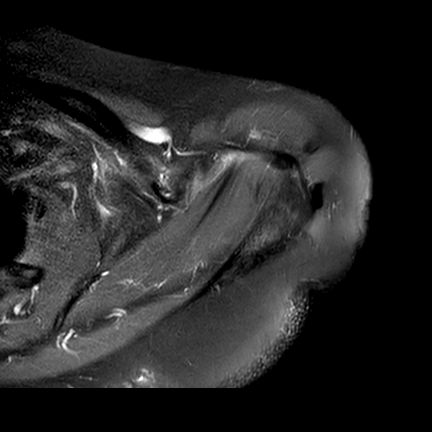
[im 28/28]
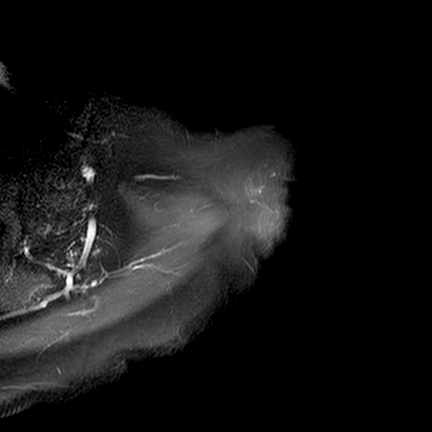

[Series 401: t2_fs_sag left · oblique · left · 3.5mm · 0.37mm/px · 8 of 30 slices shown]
[im 1/30]
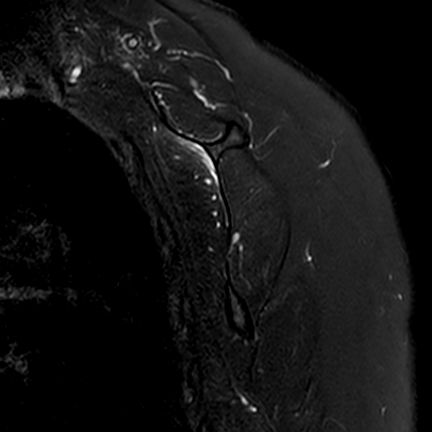
[im 5/30]
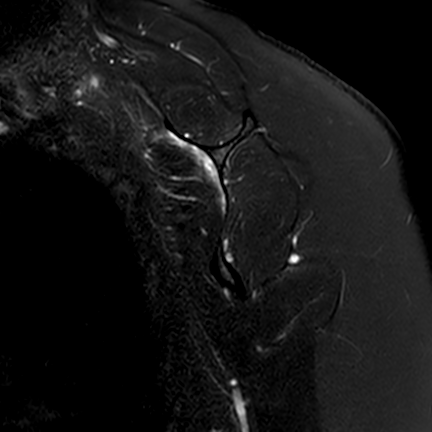
[im 9/30]
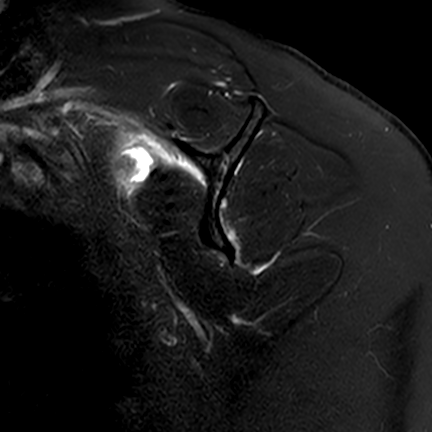
[im 13/30]
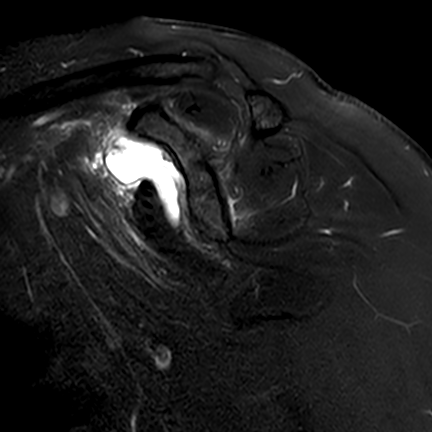
[im 17/30]
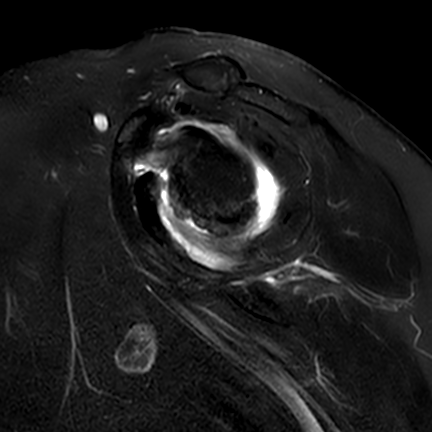
[im 21/30]
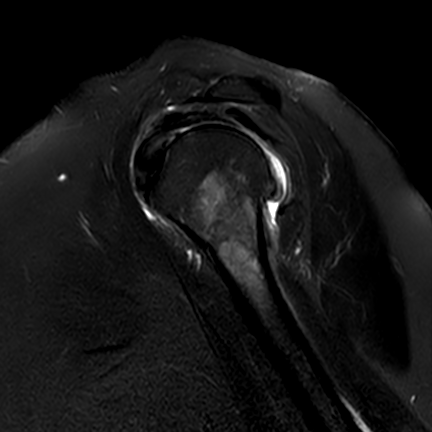
[im 25/30]
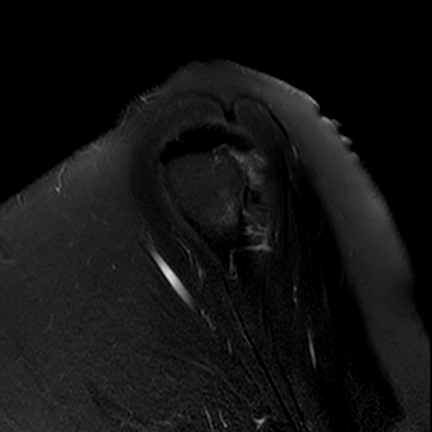
[im 30/30]
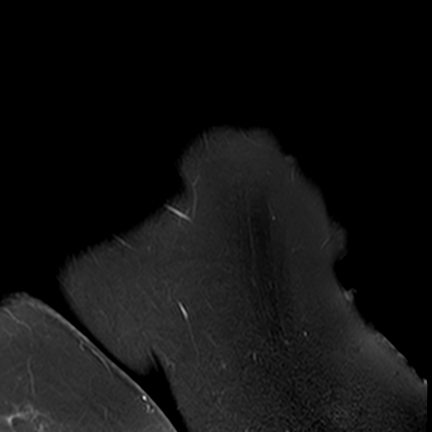

[Series 501: pd_fs_cor left · oblique · left · 3.5mm · 0.40mm/px · 6 of 24 slices shown]
[im 1/24]
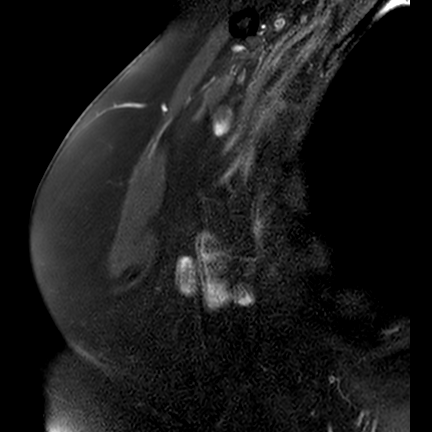
[im 5/24]
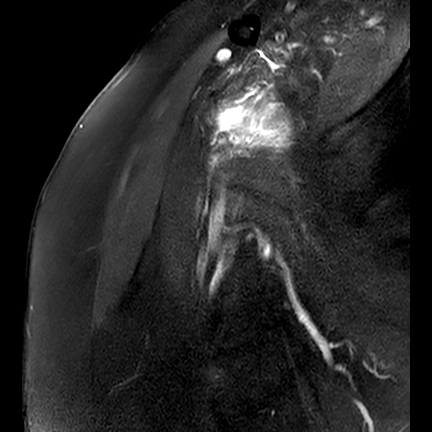
[im 10/24]
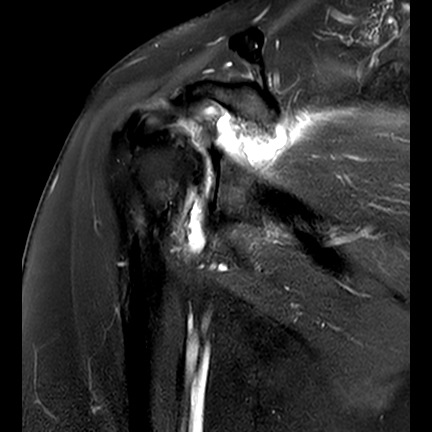
[im 14/24]
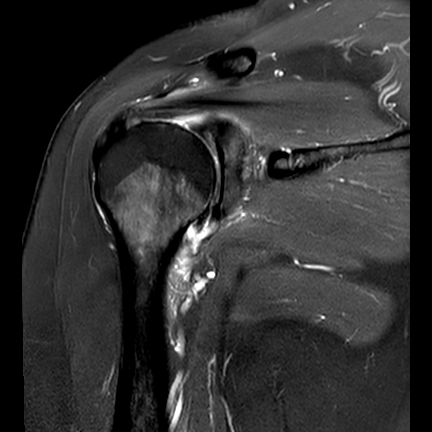
[im 19/24]
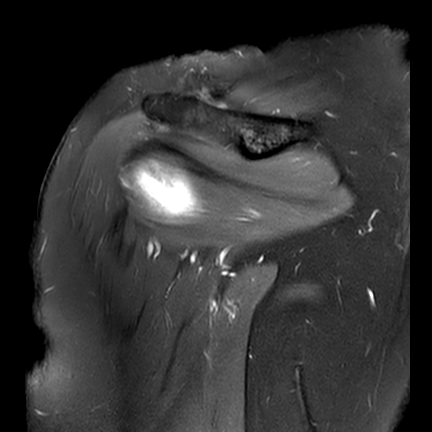
[im 24/24]
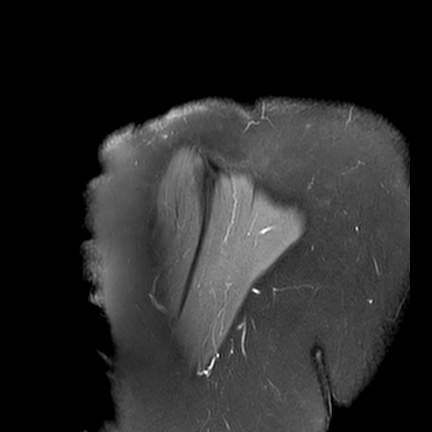

[Series 601: t1_sag left · oblique · left · 3.5mm · 0.31mm/px · 1 of 30 slices shown]
[im 1/30]
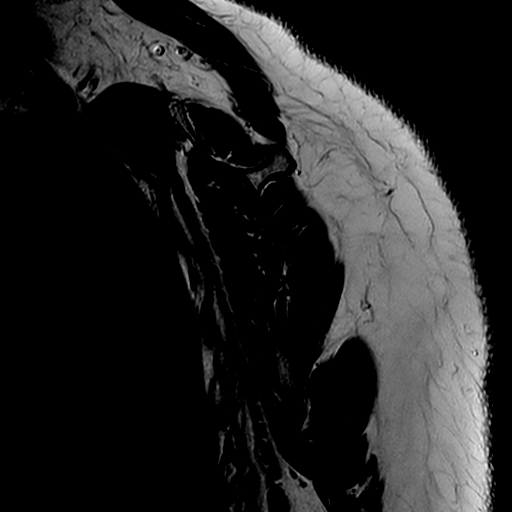

[27 of 40 positions shown; findings below may reference images not displayed]

FINDINGS: ROTATOR CUFF: Tiny low-grade partial-thickness, concealed, intrasubstance distal 
supraspinatus tendon tear and tendinosis. The infraspinatus, subscapularis and 
teres minor tendons are intact. Mild edema deep to the subscapularis and 
infraspinatus muscles. No fatty muscular atrophy. 
ACROMIOCLAVICULAR JOINT: Mild degenerative change of the acromioclavicular joint 
without mass effect upon the supraspinatus. The coracoacromial ligament is 
intact without prominent spurring at the acromial attachment. The 
acromioclavicular and coracoclavicular ligaments are preserved. The acromium is 
normal in morphology. 
GLENOHUMERAL JOINT: Moderate to marked glenohumeral joint degenerative change 
with full-thickness and partial thickness chondromalacia, small marginal 
osteophytes and subcortical cysts, moderate joint effusion, prominent synovitis 
and multifocal degenerative labral tears. The humeral head is well located 
within the glenoid fossa. No paralabral cyst. The intra-articular portion of the 
long head of the biceps tendon is negative. No shoulder joint effusion. 
BONES: The bone marrow signal intensity is negative for fracture. No Hill-Sachs 
defect. Subcortical cystic change of the humeral head. 
ADDITIONAL FINDINGS: The axillary region is negative. Subcutaneous tissues are 
negative.
IMPRESSION: 1.  Tiny low-grade partial-thickness, concealed, intrasubstance distal 
supraspinatus tendon tear and tendinosis.  
2.  Moderate to marked glenohumeral joint degenerative change, degenerative 
labral tears, moderate joint effusion and prominent synovitis. 
3.  Mild degenerative change of the acromioclavicular joint without mass effect 
upon the supraspinatus.

## 2021-12-18 IMAGING — DX SHOULDER LEFT 2 VIEWS
1 series · 3 of 3 positions shown · non-contrast
Comparison: None.

________________________________________________________________________________________________ 
SHOULDER LEFT 2 VIEWS, 12/18/2021 [DATE]: 
CLINICAL INDICATION: Pain In Left Shoulder.

[Series 1: AP · 0.14mm/px · 3 of 3 slices shown]
[im 1/3]
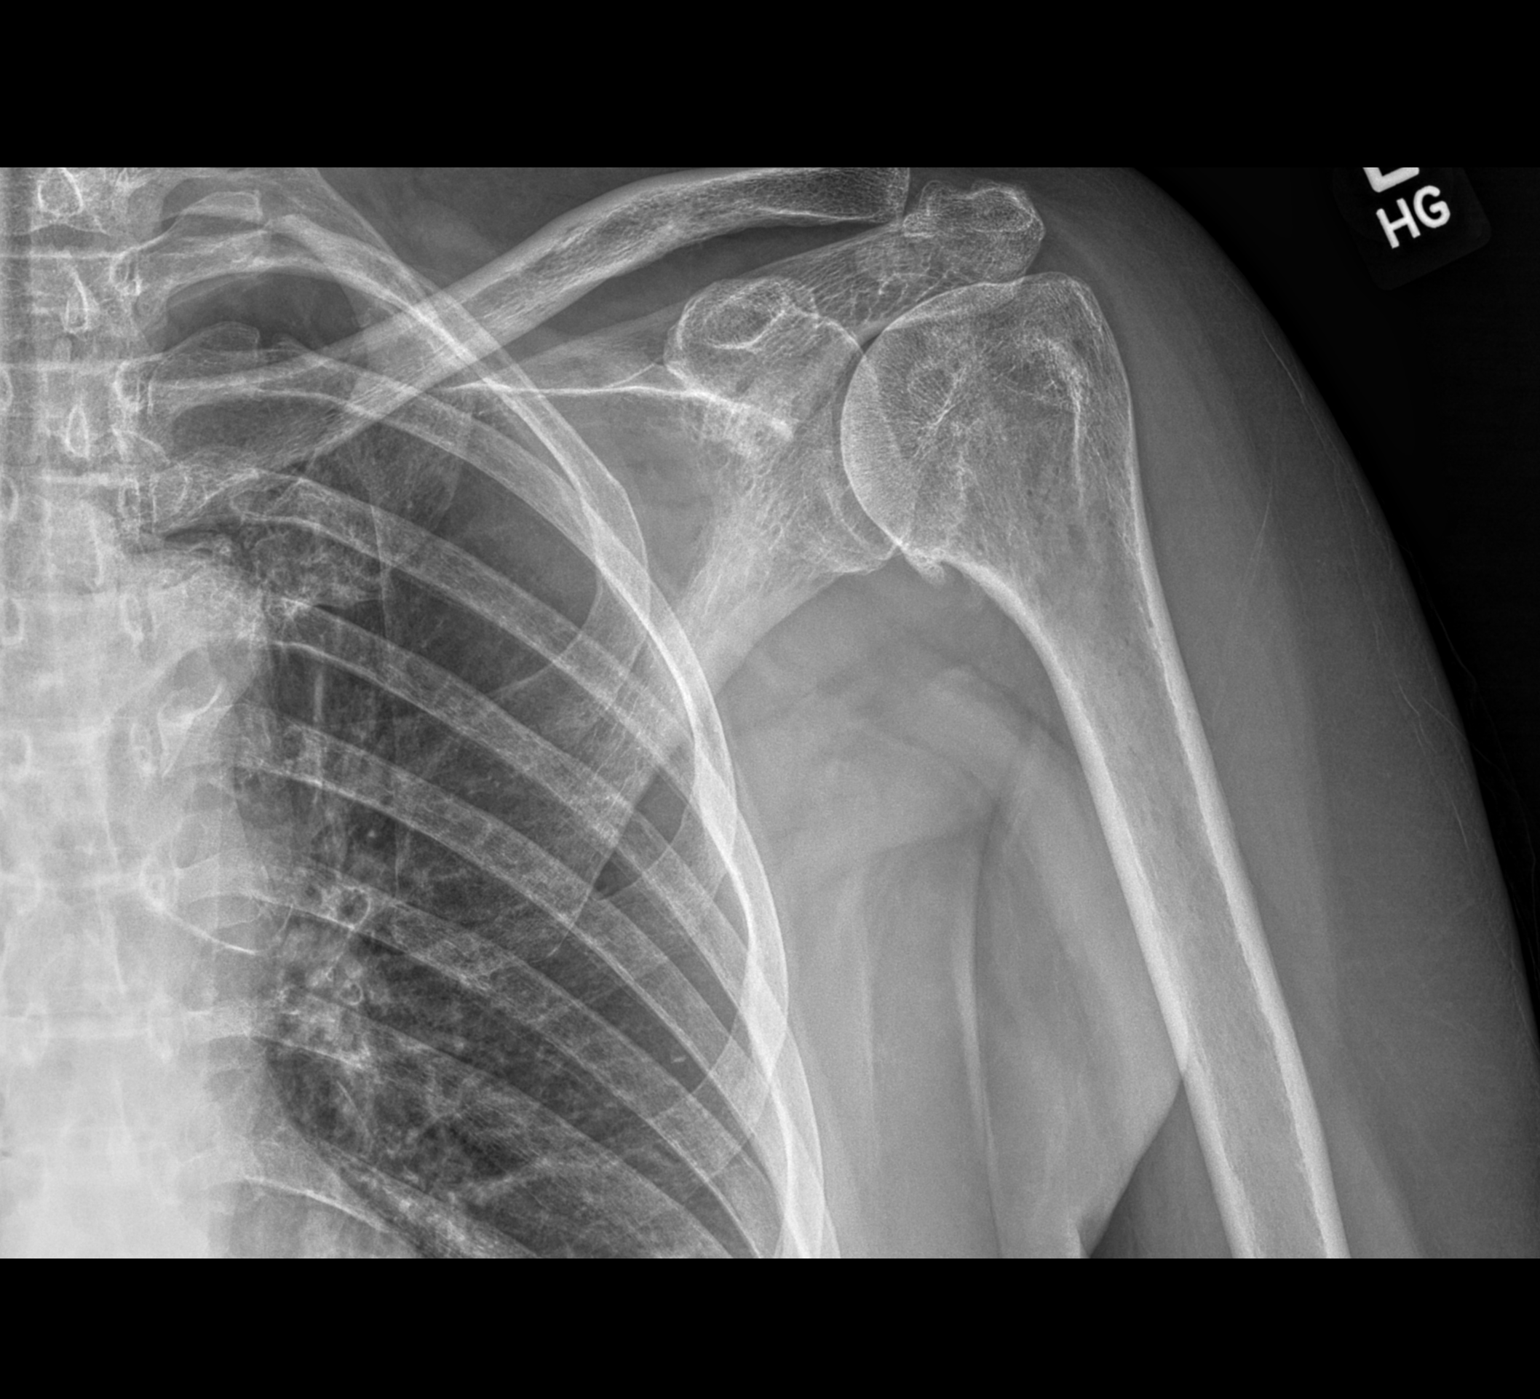
[im 2/3]
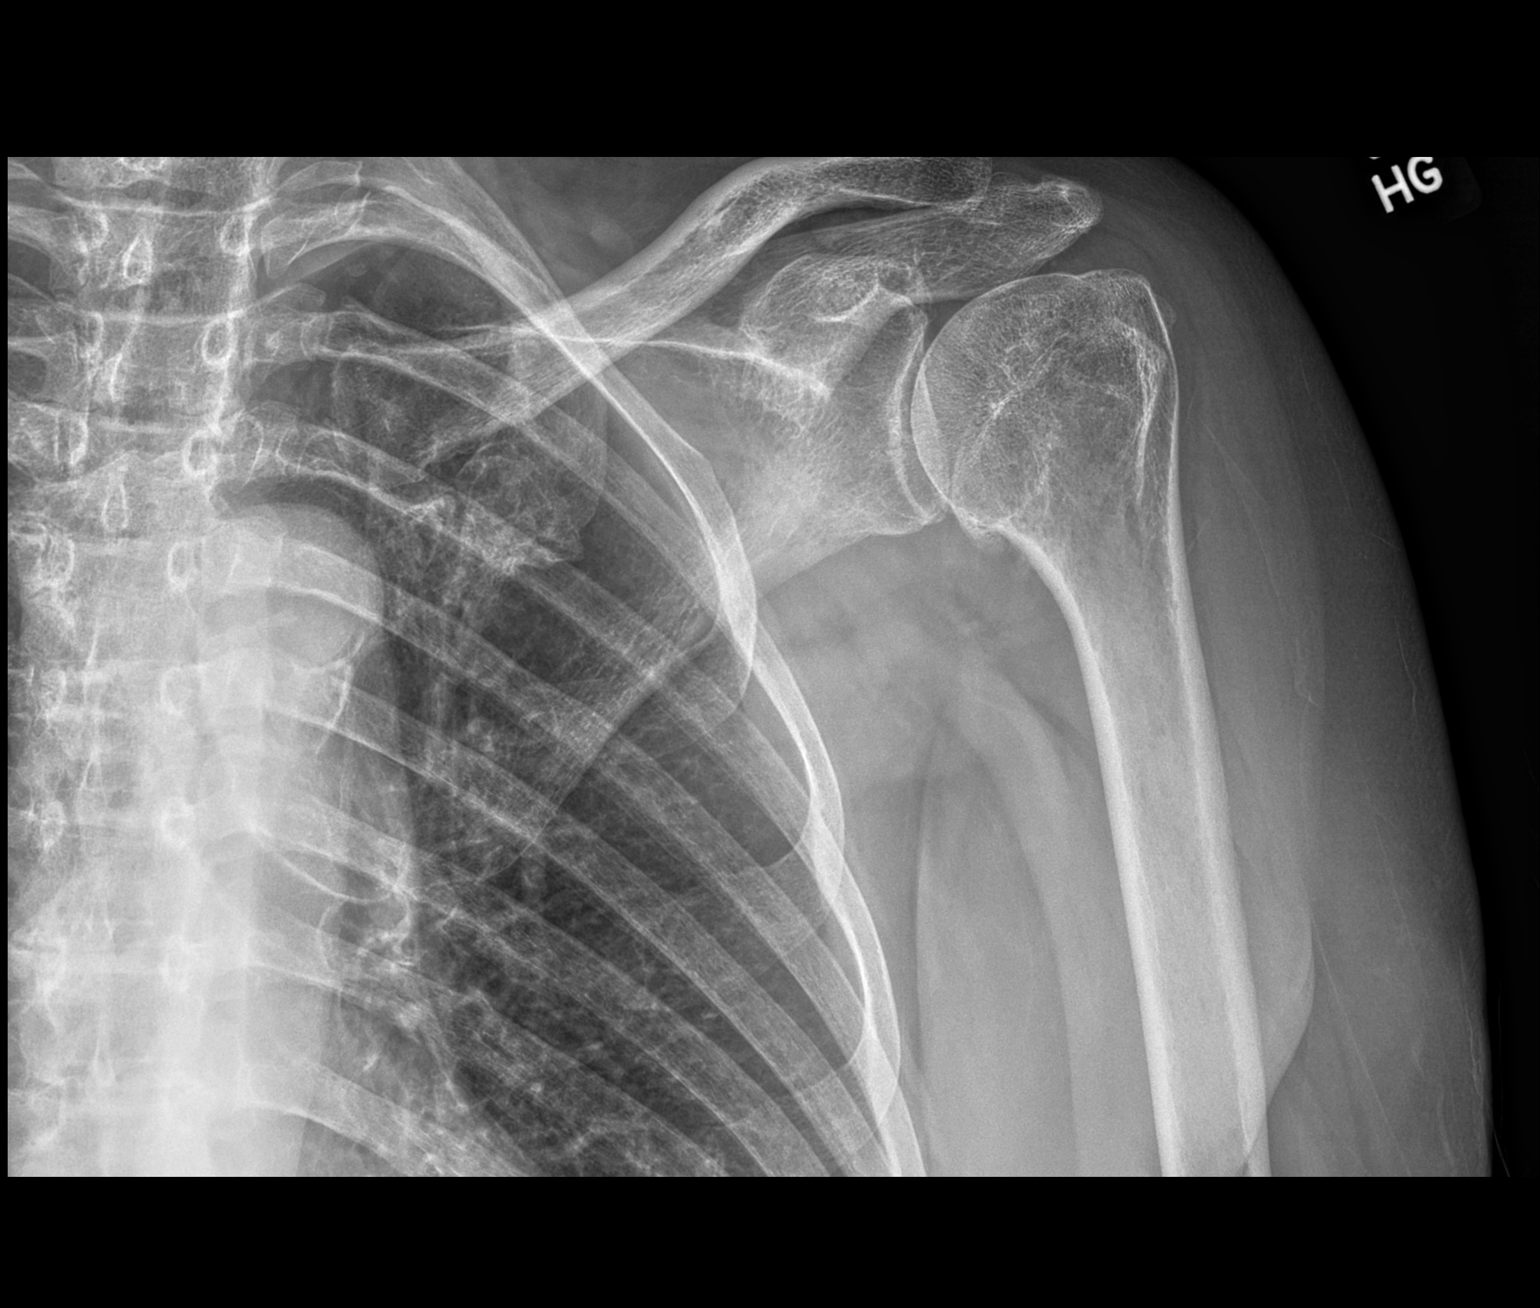
[im 3/3]
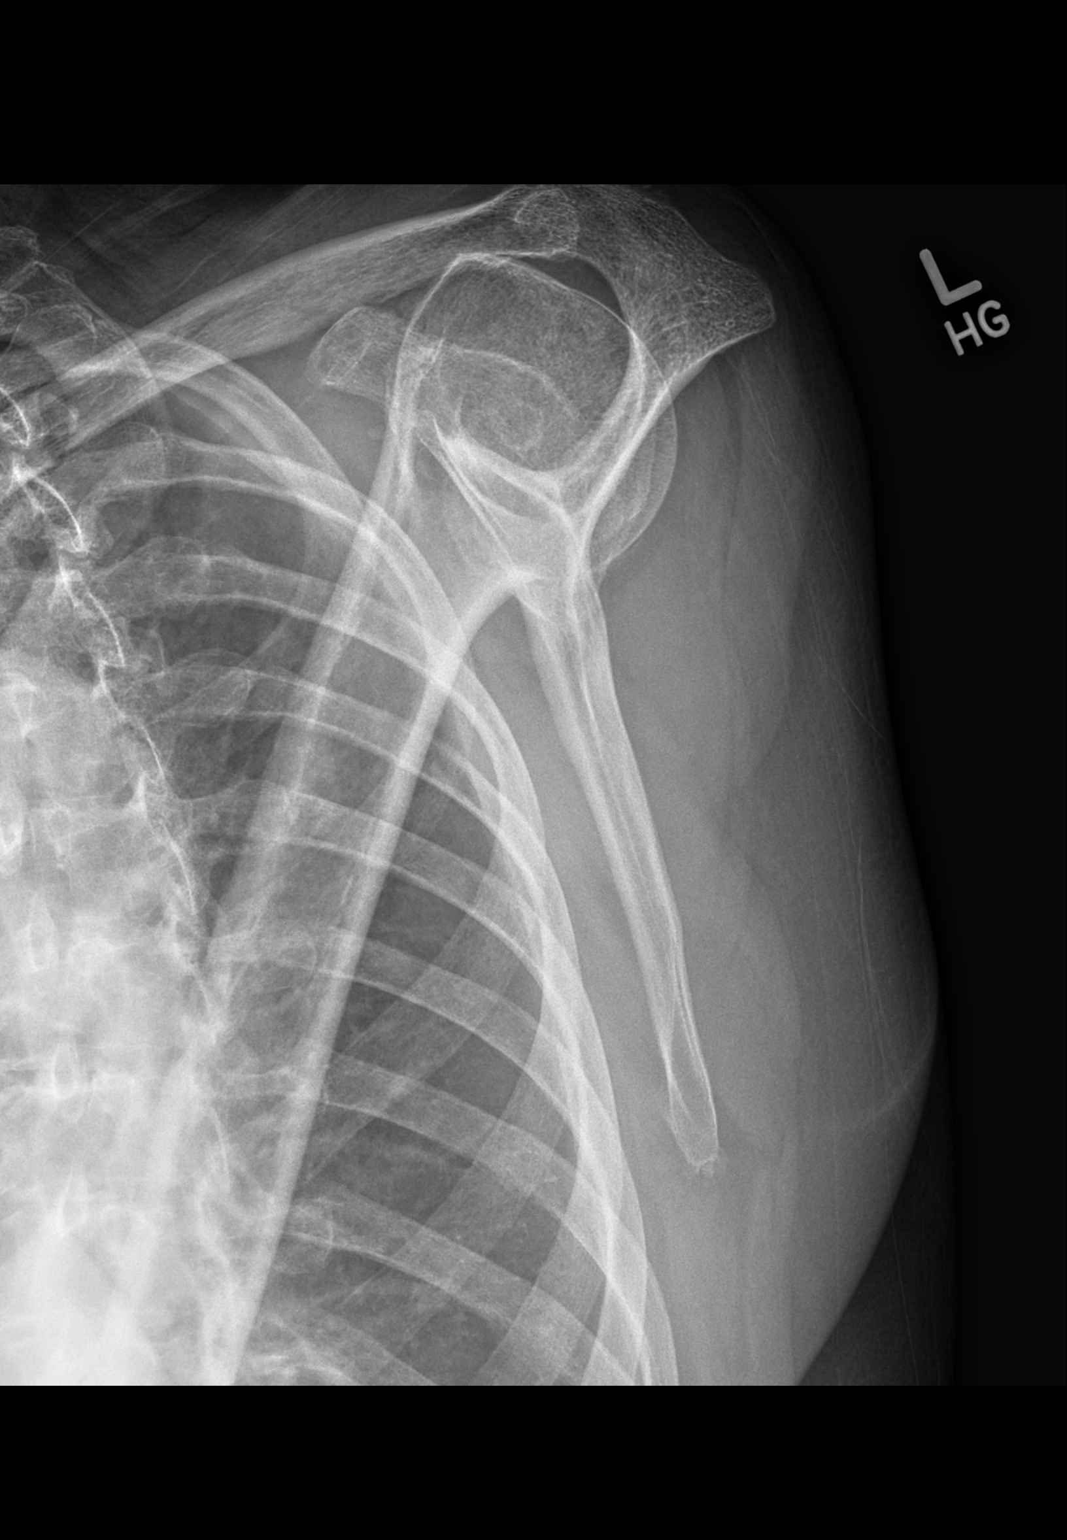

[3 of 3 positions shown; findings below may reference images not displayed]

FINDINGS: No fractures or dislocations. Moderate glenohumeral and mild AC joint 
degenerative change. Normal bone density. Atherosclerosis. No soft tissue 
swelling.
IMPRESSION: Moderate glenohumeral and mild AC joint degenerative change.

## 2021-12-25 ENCOUNTER — Telehealth (HOSPITAL_COMMUNITY)

## 2021-12-25 DIAGNOSIS — M810 Age-related osteoporosis without current pathological fracture: Secondary | ICD-10-CM

## 2021-12-25 DIAGNOSIS — Z853 Personal history of malignant neoplasm of breast: Secondary | ICD-10-CM

## 2021-12-29 ENCOUNTER — Ambulatory Visit (HOSPITAL_COMMUNITY)

## 2021-12-29 ENCOUNTER — Ambulatory Visit (HOSPITAL_COMMUNITY): Admit: 2021-12-29 | Discharge: 2021-12-29 | Payer: 59

## 2021-12-29 DIAGNOSIS — Z08 Encounter for follow-up examination after completed treatment for malignant neoplasm: Secondary | ICD-10-CM

## 2021-12-29 DIAGNOSIS — F17210 Nicotine dependence, cigarettes, uncomplicated: Secondary | ICD-10-CM

## 2021-12-29 DIAGNOSIS — E559 Vitamin D deficiency, unspecified: Secondary | ICD-10-CM

## 2021-12-29 DIAGNOSIS — Z853 Personal history of malignant neoplasm of breast: Secondary | ICD-10-CM

## 2022-01-10 ENCOUNTER — Inpatient Hospital Stay (HOSPITAL_COMMUNITY): Admit: 2022-01-10 | Discharge: 2022-01-10 | Payer: 59

## 2022-01-10 DIAGNOSIS — F17210 Nicotine dependence, cigarettes, uncomplicated: Secondary | ICD-10-CM

## 2022-01-10 IMAGING — DX CHEST PA AND LATERAL
1 series · 2 of 2 positions shown · non-contrast
Comparison: none

________________________________________________________________________________________________ 
CLINICAL INDICATION:  Pneumonia, history of asthma
TECHNIQUE: PA and lateral radiographs of the chest are compared to .

[Series 1: PA · 0.14mm/px · 2 of 2 slices shown]
[im 1/2]
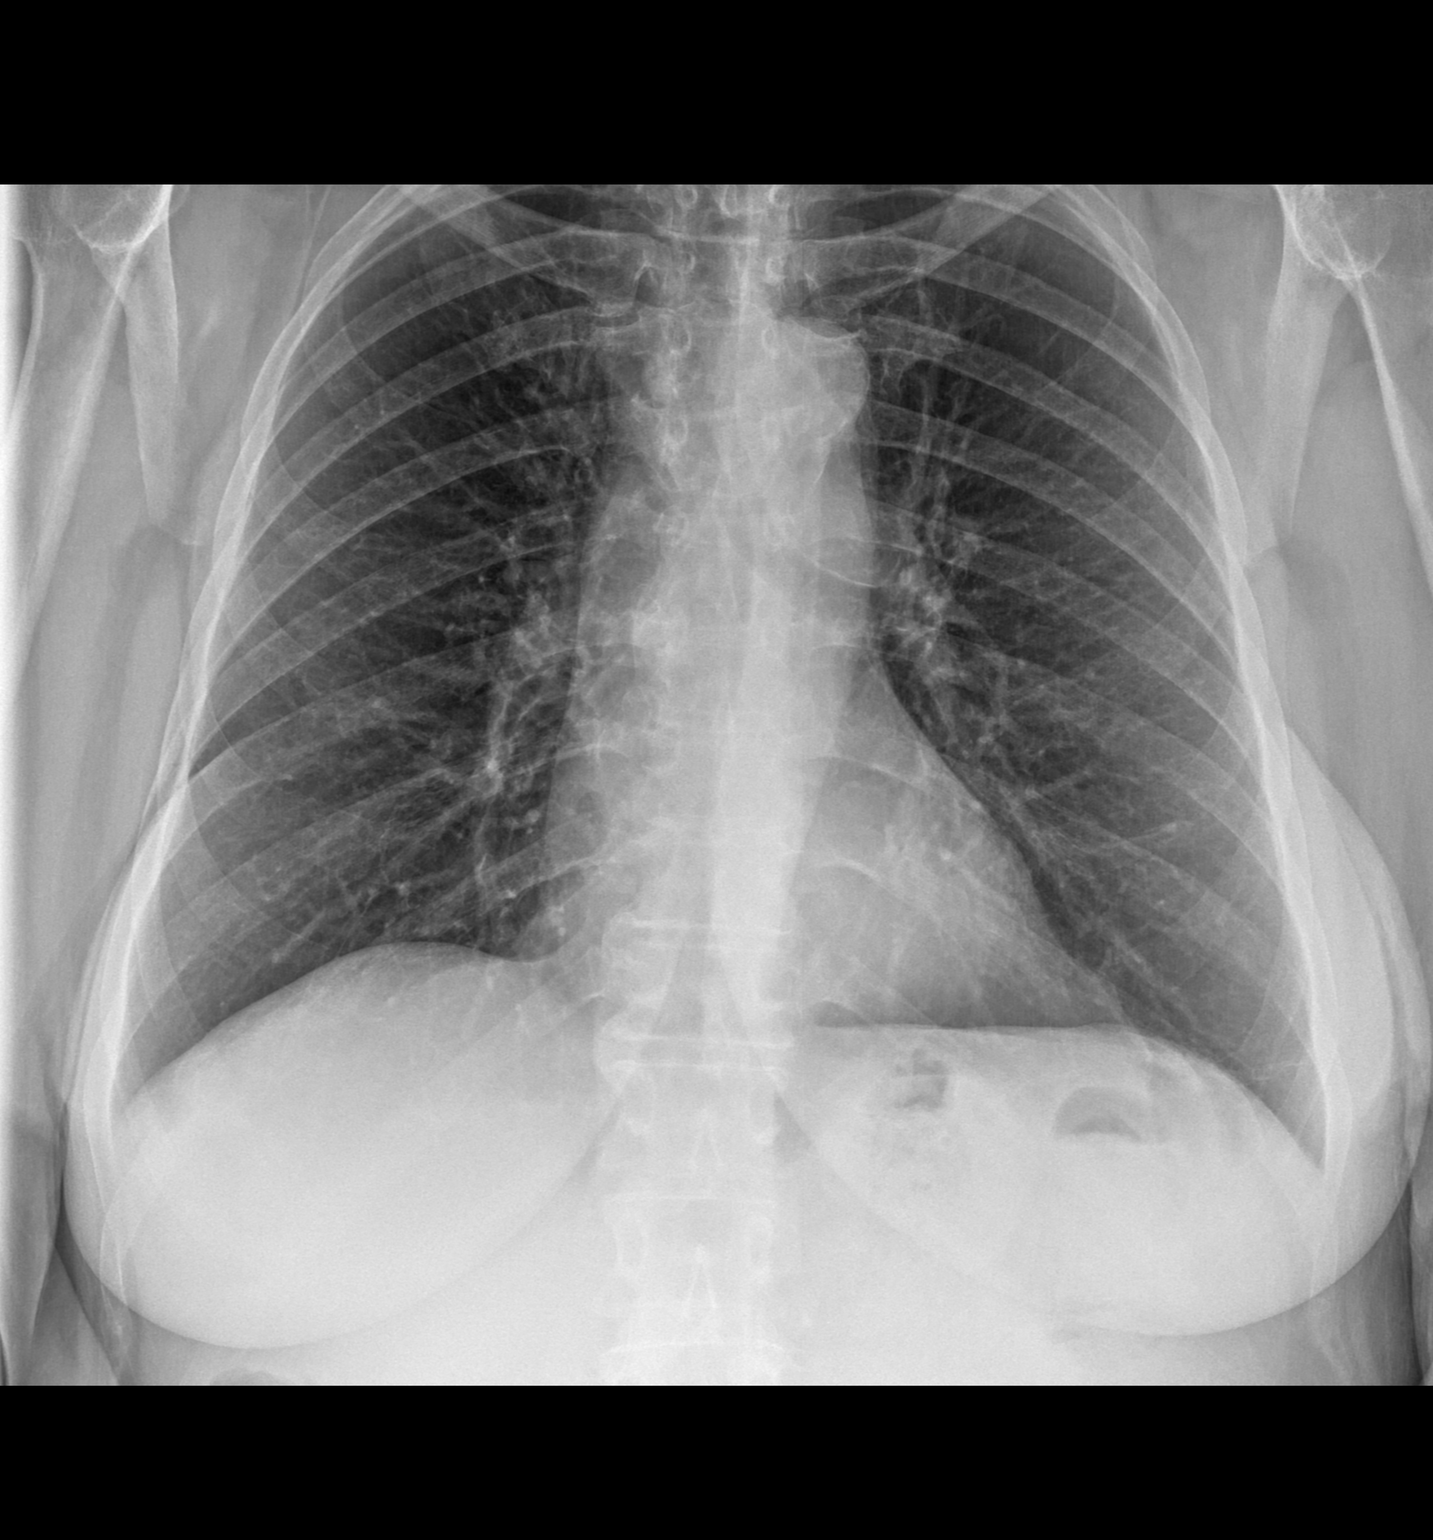
[im 2/2]
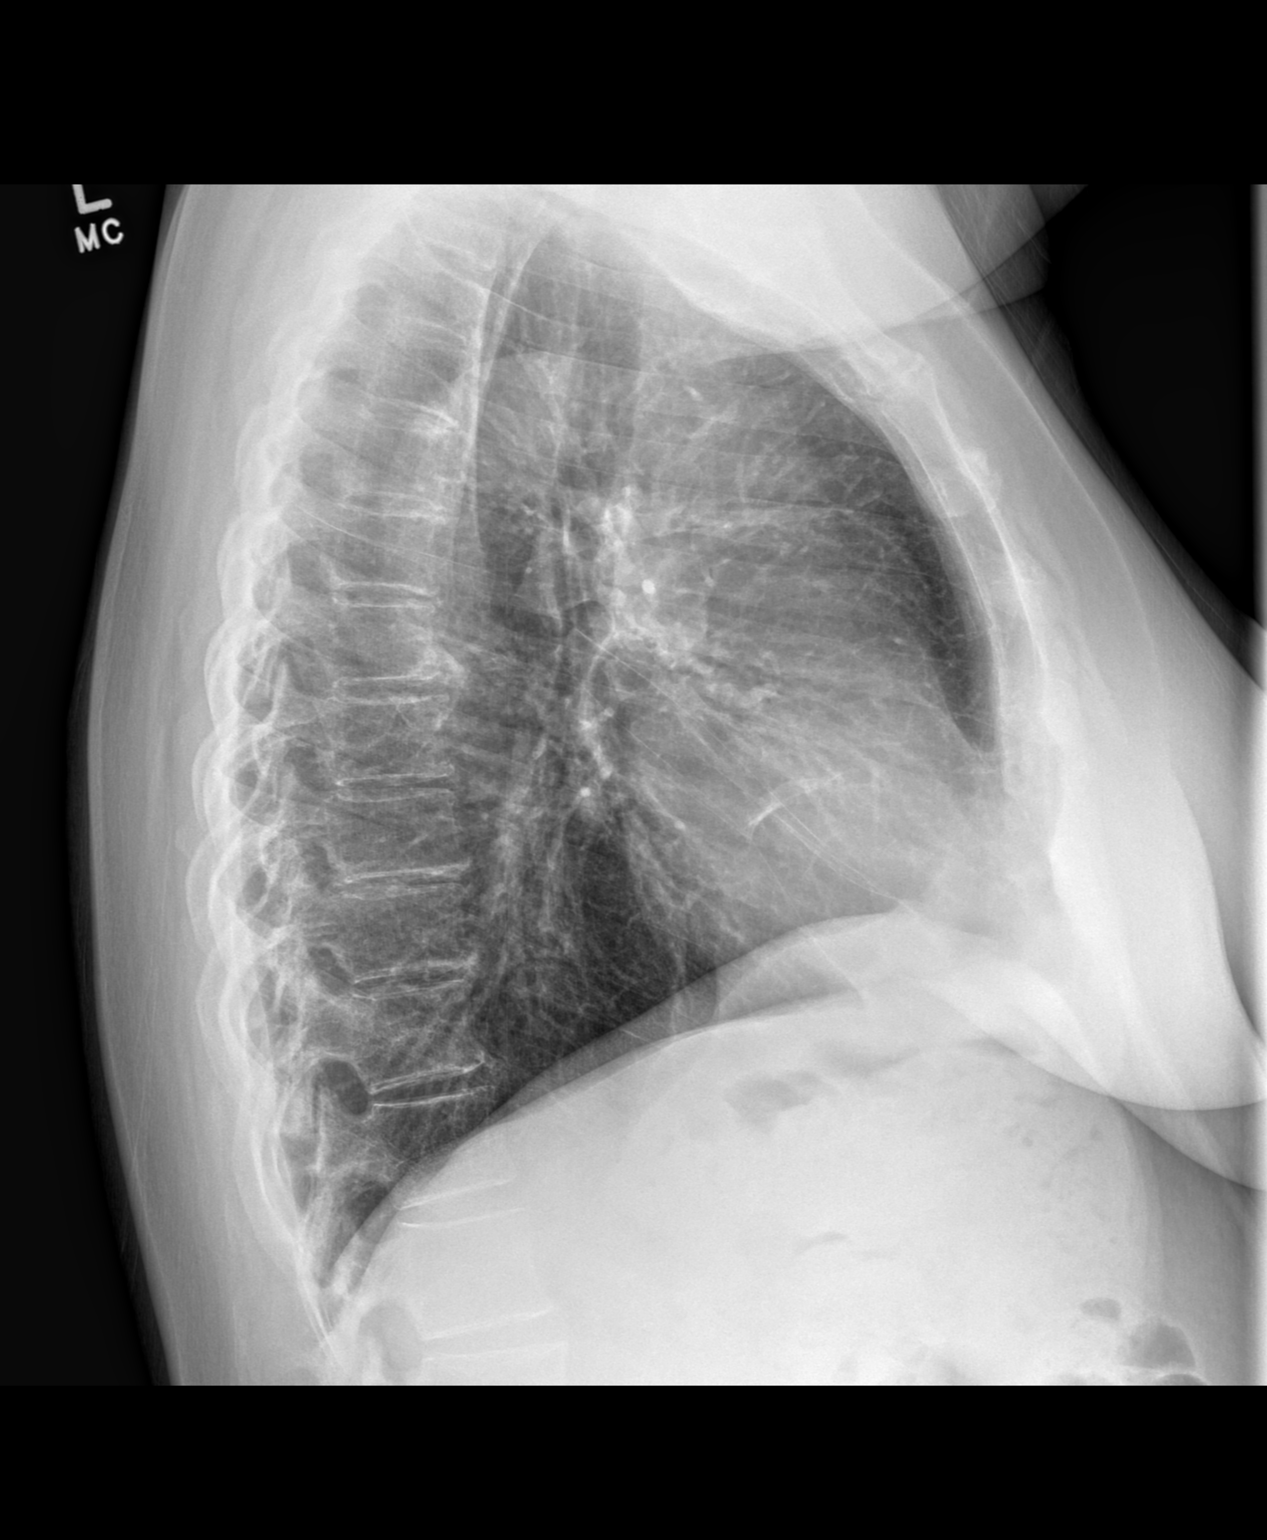

[2 of 2 positions shown; findings below may reference images not displayed]

FINDINGS: Lungs are clear and well-expanded. There is no infiltrate. Mild 
parahilar bronchial wall thickening. There is no effusion, pneumothorax or 
consolidation. The heart is not enlarged. Thoracic spine degenerative changes. 
Mild aortic ectasia.
IMPRESSION: No active cardiopulmonary findings. No infiltrate identified.

## 2022-02-14 ENCOUNTER — Ambulatory Visit (HOSPITAL_COMMUNITY)

## 2022-02-14 DIAGNOSIS — Z87891 Personal history of nicotine dependence: Secondary | ICD-10-CM

## 2022-03-08 ENCOUNTER — Ambulatory Visit (INDEPENDENT_AMBULATORY_CARE_PROVIDER_SITE_OTHER): Admit: 2022-03-08 | Discharge: 2022-03-09 | Payer: 59

## 2022-03-08 DIAGNOSIS — M810 Age-related osteoporosis without current pathological fracture: Secondary | ICD-10-CM

## 2022-03-08 DIAGNOSIS — S22080S Wedge compression fracture of T11-T12 vertebra, sequela: Secondary | ICD-10-CM

## 2022-03-12 ENCOUNTER — Ambulatory Visit (INDEPENDENT_AMBULATORY_CARE_PROVIDER_SITE_OTHER)

## 2022-03-12 DIAGNOSIS — M810 Age-related osteoporosis without current pathological fracture: Secondary | ICD-10-CM

## 2022-03-12 DIAGNOSIS — S22080S Wedge compression fracture of T11-T12 vertebra, sequela: Secondary | ICD-10-CM

## 2022-03-12 MED ORDER — TYMLOS 80 MCG/DOSE (3,120 MCG/1.56 ML) SUBCUTANEOUS PEN INJECTOR
80 ug | Freq: Every day | SUBCUTANEOUS | 11 refills | 28.00000 days | Status: AC
Start: 2022-03-12 — End: ?

## 2022-04-04 ENCOUNTER — Ambulatory Visit (INDEPENDENT_AMBULATORY_CARE_PROVIDER_SITE_OTHER)

## 2022-04-04 DIAGNOSIS — S22080S Wedge compression fracture of T11-T12 vertebra, sequela: Secondary | ICD-10-CM

## 2022-04-04 DIAGNOSIS — M810 Age-related osteoporosis without current pathological fracture: Secondary | ICD-10-CM

## 2022-04-04 MED ORDER — TERIPARATIDE 20 MCG/DOSE (600 MCG/2.4 ML) SUBCUTANEOUS PEN INJECTOR
20 ug | PEN_INJECTOR | Freq: Every day | SUBCUTANEOUS | 11 refills | 28.00 days | Status: AC
Start: 2022-04-04 — End: ?
  Filled 2022-04-05: qty 2.4, 28d supply, fill #0

## 2022-04-23 ENCOUNTER — Ambulatory Visit (INDEPENDENT_AMBULATORY_CARE_PROVIDER_SITE_OTHER)

## 2022-04-23 DIAGNOSIS — S22080S Wedge compression fracture of T11-T12 vertebra, sequela: Secondary | ICD-10-CM

## 2022-04-23 DIAGNOSIS — M810 Age-related osteoporosis without current pathological fracture: Secondary | ICD-10-CM

## 2022-04-23 MED ORDER — TERIPARATIDE 20 MCG/DOSE (600 MCG/2.4 ML) SUBCUTANEOUS PEN INJECTOR
20 ug | PEN_INJECTOR | Freq: Every day | SUBCUTANEOUS | 11 refills | 28.00 days | Status: AC
Start: 2022-04-23 — End: ?

## 2022-05-10 ENCOUNTER — Ambulatory Visit (INDEPENDENT_AMBULATORY_CARE_PROVIDER_SITE_OTHER): Admit: 2022-05-10 | Discharge: 2022-05-10 | Payer: 59

## 2022-05-10 ENCOUNTER — Ambulatory Visit (INDEPENDENT_AMBULATORY_CARE_PROVIDER_SITE_OTHER)

## 2022-05-10 DIAGNOSIS — M810 Age-related osteoporosis without current pathological fracture: Secondary | ICD-10-CM

## 2022-05-10 MED ORDER — PEN NEEDLE, DIABETIC 32 GAUGE X 5/32"
1 | Freq: Every day | 3 refills | 95.00 days | Status: AC
Start: 2022-05-10 — End: ?

## 2022-09-07 ENCOUNTER — Ambulatory Visit (INDEPENDENT_AMBULATORY_CARE_PROVIDER_SITE_OTHER): Admit: 2022-09-07 | Discharge: 2022-09-07 | Payer: Medicare Other

## 2022-09-07 DIAGNOSIS — E559 Vitamin D deficiency, unspecified: Secondary | ICD-10-CM

## 2022-09-07 DIAGNOSIS — M810 Age-related osteoporosis without current pathological fracture: Secondary | ICD-10-CM

## 2022-09-07 DIAGNOSIS — S22080S Wedge compression fracture of T11-T12 vertebra, sequela: Secondary | ICD-10-CM

## 2022-10-29 ENCOUNTER — Inpatient Hospital Stay: Admit: 2022-10-29 | Discharge: 2022-10-29 | Disposition: A | Discharge status: Home / Self Care | Payer: Medicare Other

## 2022-10-29 ENCOUNTER — Emergency Department: Admit: 2022-10-29 | Discharge: 2022-10-29 | Disposition: A | Discharge status: Home / Self Care | Payer: Medicare Other

## 2022-10-29 DIAGNOSIS — M79604 Pain in right leg: Principal | ICD-10-CM

## 2022-10-29 DIAGNOSIS — R2241 Localized swelling, mass and lump, right lower limb: Secondary | ICD-10-CM

## 2022-10-29 MED ORDER — ACETAMINOPHEN 500 MG TABLET
1000 mg | ORAL_TABLET | Freq: Three times a day (TID) | ORAL | 0 refills | 30.00 days | Status: AC | PRN
Start: 2022-10-29 — End: ?

## 2023-01-11 ENCOUNTER — Ambulatory Visit

## 2023-01-11 ENCOUNTER — Ambulatory Visit: Admit: 2023-01-11 | Discharge: 2023-01-11 | Payer: Medicare Other

## 2023-01-11 DIAGNOSIS — Z87891 Personal history of nicotine dependence: Secondary | ICD-10-CM

## 2023-01-28 ENCOUNTER — Ambulatory Visit: Admit: 2023-01-28 | Discharge: 2023-01-28 | Payer: Medicare Other

## 2023-01-28 ENCOUNTER — Ambulatory Visit

## 2023-01-28 DIAGNOSIS — E559 Vitamin D deficiency, unspecified: Secondary | ICD-10-CM

## 2023-01-28 DIAGNOSIS — Z853 Personal history of malignant neoplasm of breast: Secondary | ICD-10-CM

## 2023-01-28 DIAGNOSIS — F17210 Nicotine dependence, cigarettes, uncomplicated: Secondary | ICD-10-CM

## 2023-01-28 DIAGNOSIS — Z08 Encounter for follow-up examination after completed treatment for malignant neoplasm: Secondary | ICD-10-CM

## 2023-04-01 ENCOUNTER — Ambulatory Visit

## 2023-04-01 DIAGNOSIS — M810 Age-related osteoporosis without current pathological fracture: Secondary | ICD-10-CM

## 2023-04-01 DIAGNOSIS — S22080S Wedge compression fracture of T11-T12 vertebra, sequela: Secondary | ICD-10-CM

## 2023-04-01 MED ORDER — TERIPARATIDE 20 MCG/DOSE (600 MCG/2.4 ML) SUBCUTANEOUS PEN INJECTOR
20 ug | PEN_INJECTOR | Freq: Every day | SUBCUTANEOUS | 11 refills | 28.00 days | Status: AC
Start: 2023-04-01 — End: ?

## 2023-04-04 ENCOUNTER — Ambulatory Visit: Admit: 2023-04-04 | Discharge: 2023-04-04 | Payer: MEDICARE

## 2023-05-14 ENCOUNTER — Inpatient Hospital Stay: Admit: 2023-05-13 | Discharge: 2023-05-14 | Payer: MEDICARE

## 2023-05-14 DIAGNOSIS — S22080S Wedge compression fracture of T11-T12 vertebra, sequela: Secondary | ICD-10-CM

## 2023-05-14 DIAGNOSIS — M810 Age-related osteoporosis without current pathological fracture: Principal | ICD-10-CM

## 2023-05-31 ENCOUNTER — Ambulatory Visit: Admit: 2023-05-31 | Discharge: 2023-05-31 | Payer: MEDICARE

## 2023-05-31 DIAGNOSIS — M810 Age-related osteoporosis without current pathological fracture: Secondary | ICD-10-CM

## 2023-05-31 DIAGNOSIS — E559 Vitamin D deficiency, unspecified: Secondary | ICD-10-CM

## 2023-05-31 DIAGNOSIS — S22080S Wedge compression fracture of T11-T12 vertebra, sequela: Secondary | ICD-10-CM

## 2023-06-12 ENCOUNTER — Ambulatory Visit: Admit: 2023-06-12 | Discharge: 2023-06-13 | Payer: MEDICARE

## 2023-06-12 DIAGNOSIS — J441 Chronic obstructive pulmonary disease with (acute) exacerbation: Principal | ICD-10-CM

## 2023-06-12 MED ORDER — BENZONATATE 200 MG CAPSULE
200 mg | ORAL_CAPSULE | Freq: Three times a day (TID) | ORAL | 0 refills | Status: AC | PRN
Start: 2023-06-12 — End: ?

## 2023-06-16 ENCOUNTER — Inpatient Hospital Stay: Admit: 2023-06-16 | Discharge: 2023-06-19 | Disposition: A | Discharge status: Home / Self Care | Payer: MEDICARE

## 2023-06-16 DIAGNOSIS — R000 Tachycardia, unspecified: Secondary | ICD-10-CM

## 2023-06-16 DIAGNOSIS — R0602 Shortness of breath: Secondary | ICD-10-CM

## 2023-06-16 DIAGNOSIS — J441 Chronic obstructive pulmonary disease with (acute) exacerbation: Principal | ICD-10-CM

## 2023-06-19 ENCOUNTER — Inpatient Hospital Stay: Admit: 2023-06-16 | Discharge: 2023-06-19 | Payer: MEDICARE

## 2023-06-19 DIAGNOSIS — E1165 Type 2 diabetes mellitus with hyperglycemia: Secondary | ICD-10-CM

## 2023-06-19 DIAGNOSIS — L659 Nonscarring hair loss, unspecified: Secondary | ICD-10-CM

## 2023-06-19 DIAGNOSIS — Z1152 Encounter for screening for COVID-19: Secondary | ICD-10-CM

## 2023-06-19 DIAGNOSIS — F17290 Nicotine dependence, other tobacco product, uncomplicated: Secondary | ICD-10-CM

## 2023-06-19 DIAGNOSIS — J441 Chronic obstructive pulmonary disease with (acute) exacerbation: Principal | ICD-10-CM

## 2023-06-19 DIAGNOSIS — Z9013 Acquired absence of bilateral breasts and nipples: Secondary | ICD-10-CM

## 2023-06-19 DIAGNOSIS — K900 Celiac disease: Secondary | ICD-10-CM

## 2023-06-19 DIAGNOSIS — D72829 Elevated white blood cell count, unspecified: Secondary | ICD-10-CM

## 2023-06-19 DIAGNOSIS — M810 Age-related osteoporosis without current pathological fracture: Secondary | ICD-10-CM

## 2023-06-19 DIAGNOSIS — Z9049 Acquired absence of other specified parts of digestive tract: Secondary | ICD-10-CM

## 2023-06-19 DIAGNOSIS — Z7982 Long term (current) use of aspirin: Secondary | ICD-10-CM

## 2023-06-19 DIAGNOSIS — T380X5A Adverse effect of glucocorticoids and synthetic analogues, initial encounter: Secondary | ICD-10-CM

## 2023-06-19 DIAGNOSIS — Z853 Personal history of malignant neoplasm of breast: Secondary | ICD-10-CM

## 2023-06-19 MED ORDER — NEBULIZER AND COMPRESSOR
0 refills | Status: AC
Start: 2023-06-19 — End: 2023-06-24

## 2023-06-19 MED ORDER — IPRATROPIUM 0.5 MG-ALBUTEROL 3 MG (2.5 MG BASE)/3 ML NEBULIZATION SOLN
3 mL | Freq: Four times a day (QID) | RESPIRATORY_TRACT | 0 refills | Status: AC
Start: 2023-06-19 — End: ?

## 2023-06-19 MED ORDER — PANTOPRAZOLE 40 MG TABLET,DELAYED RELEASE
40 mg | ORAL_TABLET | Freq: Every day | ORAL | 0 refills | 90.00 days | Status: AC
Start: 2023-06-19 — End: ?

## 2023-06-19 MED ORDER — LEVOFLOXACIN 500 MG TABLET
500 mg | ORAL_TABLET | Freq: Every day | ORAL | 0 refills | 30.00 days | Status: AC
Start: 2023-06-19 — End: ?

## 2023-06-19 MED ORDER — FLUTICASONE FUR. 200 MCG-UMECLID 62.5 MCG-VILANT 25 MCG INHALAT.POWDER
1 | Freq: Every day | RESPIRATORY_TRACT | 0 refills | 30.00 days | Status: AC
Start: 2023-06-19 — End: ?

## 2023-06-19 MED ORDER — PREDNISONE 20 MG TABLET
40 mg | ORAL_TABLET | Freq: Every day | ORAL | 0 refills | 90.00000 days | Status: AC
Start: 2023-06-19 — End: ?

## 2023-06-19 MED ORDER — DEXTROMETHORPHAN-GUAIFENESIN 10 MG-100 MG/5 ML ORAL SYRUP
5 mL | Freq: Four times a day (QID) | ORAL | 0 refills | Status: AC
Start: 2023-06-19 — End: ?

## 2023-06-24 ENCOUNTER — Ambulatory Visit: Admit: 2023-06-24 | Discharge: 2023-06-26 | Payer: MEDICARE

## 2023-06-24 DIAGNOSIS — J309 Allergic rhinitis, unspecified: Secondary | ICD-10-CM

## 2023-06-24 DIAGNOSIS — J441 Chronic obstructive pulmonary disease with (acute) exacerbation: Principal | ICD-10-CM

## 2023-06-24 DIAGNOSIS — B3731 Vagina, candidiasis: Secondary | ICD-10-CM

## 2023-06-24 MED ORDER — FLUCONAZOLE 150 MG TABLET
150 mg | ORAL_TABLET | ORAL | 0 refills | Status: AC
Start: 2023-06-24 — End: ?

## 2023-06-24 MED ORDER — RYMED (DEXCHLORPHENIRAMINE-PHENYLEPHRINE) 2 MG-10 MG TABLET
1 | ORAL_TABLET | ORAL | 0 refills | Status: AC | PRN
Start: 2023-06-24 — End: ?

## 2023-06-24 MED ORDER — FLUTICASONE PROPIONATE 50 MCG/ACTUATION NASAL SPRAY,SUSPENSION
1 | Freq: Two times a day (BID) | NASAL | 11 refills | Status: AC
Start: 2023-06-24 — End: ?

## 2023-07-01 ENCOUNTER — Ambulatory Visit

## 2023-07-01 DIAGNOSIS — M810 Age-related osteoporosis without current pathological fracture: Secondary | ICD-10-CM

## 2023-07-01 MED ORDER — PEN NEEDLE, DIABETIC 32 GAUGE X 5/32"
1 | Freq: Every day | 3 refills | 95.00 days | Status: AC
Start: 2023-07-01 — End: ?

## 2023-07-22 ENCOUNTER — Ambulatory Visit: Admit: 2023-07-22 | Discharge: 2023-07-22 | Payer: MEDICARE

## 2023-07-22 DIAGNOSIS — I10 Essential (primary) hypertension: Secondary | ICD-10-CM

## 2023-07-22 DIAGNOSIS — J438 Other emphysema: Principal | ICD-10-CM

## 2023-07-22 DIAGNOSIS — E119 Type 2 diabetes mellitus without complications: Secondary | ICD-10-CM

## 2023-07-22 DIAGNOSIS — E782 Mixed hyperlipidemia: Secondary | ICD-10-CM

## 2023-10-10 ENCOUNTER — Ambulatory Visit: Admit: 2023-10-10 | Discharge: 2023-10-10 | Payer: MEDICARE

## 2023-10-10 DIAGNOSIS — E612 Magnesium deficiency: Secondary | ICD-10-CM

## 2023-10-10 DIAGNOSIS — I10 Essential (primary) hypertension: Secondary | ICD-10-CM

## 2023-10-10 DIAGNOSIS — Z1339 Encounter for screening examination for other mental health and behavioral disorders: Secondary | ICD-10-CM

## 2023-10-10 DIAGNOSIS — Z0000 Encounter for general adult medical examination without abnormal findings: Secondary | ICD-10-CM

## 2023-10-10 DIAGNOSIS — E559 Vitamin D deficiency, unspecified: Principal | ICD-10-CM

## 2023-10-10 DIAGNOSIS — E119 Type 2 diabetes mellitus without complications: Secondary | ICD-10-CM

## 2023-10-10 DIAGNOSIS — Z1331 Encounter for screening for depression: Secondary | ICD-10-CM

## 2023-10-10 DIAGNOSIS — J441 Chronic obstructive pulmonary disease with (acute) exacerbation: Secondary | ICD-10-CM

## 2023-10-10 DIAGNOSIS — E782 Mixed hyperlipidemia: Secondary | ICD-10-CM

## 2023-11-01 ENCOUNTER — Ambulatory Visit

## 2023-11-01 DIAGNOSIS — Z87891 Personal history of nicotine dependence: Principal | ICD-10-CM

## 2023-12-23 ENCOUNTER — Ambulatory Visit

## 2023-12-23 DIAGNOSIS — E119 Type 2 diabetes mellitus without complications: Principal | ICD-10-CM

## 2023-12-23 MED ORDER — ACCU-CHEK AVIVA PLUS TEST STRIPS
Freq: Every day | 3 refills | Status: AC
Start: 2023-12-23 — End: ?

## 2024-01-06 ENCOUNTER — Ambulatory Visit: Admit: 2024-01-06 | Discharge: 2024-01-06 | Payer: MEDICARE

## 2024-01-06 DIAGNOSIS — J449 Chronic obstructive pulmonary disease, unspecified: Principal | ICD-10-CM

## 2024-01-13 ENCOUNTER — Ambulatory Visit: Admit: 2024-01-13 | Discharge: 2024-01-13 | Payer: MEDICARE

## 2024-01-13 DIAGNOSIS — Z87891 Personal history of nicotine dependence: Principal | ICD-10-CM

## 2024-01-14 ENCOUNTER — Ambulatory Visit

## 2024-01-14 DIAGNOSIS — Z87891 Personal history of nicotine dependence: Principal | ICD-10-CM

## 2024-02-03 ENCOUNTER — Ambulatory Visit: Admit: 2024-02-03 | Discharge: 2024-02-04 | Payer: MEDICARE

## 2024-02-03 ENCOUNTER — Ambulatory Visit

## 2024-02-03 DIAGNOSIS — E559 Vitamin D deficiency, unspecified: Secondary | ICD-10-CM

## 2024-02-03 DIAGNOSIS — F17210 Nicotine dependence, cigarettes, uncomplicated: Secondary | ICD-10-CM

## 2024-02-03 DIAGNOSIS — Z853 Personal history of malignant neoplasm of breast: Principal | ICD-10-CM
